# Patient Record
Sex: Female | Born: 1950 | Race: White | Hispanic: No | Marital: Single | Smoking: Never smoker
Health system: Southern US, Community
[De-identification: ages and names within clinical notes are randomized; demographics above are authoritative.]

## PROBLEM LIST (undated history)

## (undated) DIAGNOSIS — E7800 Pure hypercholesterolemia, unspecified: Secondary | ICD-10-CM

## (undated) DIAGNOSIS — F0390 Unspecified dementia without behavioral disturbance: Secondary | ICD-10-CM

## (undated) DIAGNOSIS — E079 Disorder of thyroid, unspecified: Secondary | ICD-10-CM

## (undated) DIAGNOSIS — M6530 Trigger finger, unspecified finger: Secondary | ICD-10-CM

## (undated) DIAGNOSIS — F0280 Dementia in other diseases classified elsewhere without behavioral disturbance: Secondary | ICD-10-CM

## (undated) DIAGNOSIS — E039 Hypothyroidism, unspecified: Secondary | ICD-10-CM

## (undated) HISTORY — PX: SMALL INTESTINE SURGERY: SHX150

---

## 2000-02-07 ENCOUNTER — Other Ambulatory Visit: Admission: RE | Admit: 2000-02-07 | Discharge: 2000-02-07 | Payer: Self-pay | Admitting: Obstetrics and Gynecology

## 2001-05-15 ENCOUNTER — Other Ambulatory Visit: Admission: RE | Admit: 2001-05-15 | Discharge: 2001-05-15 | Payer: Self-pay | Admitting: Obstetrics and Gynecology

## 2001-09-30 ENCOUNTER — Ambulatory Visit (HOSPITAL_COMMUNITY): Admission: RE | Admit: 2001-09-30 | Discharge: 2001-09-30 | Payer: Self-pay | Admitting: Obstetrics and Gynecology

## 2001-09-30 ENCOUNTER — Encounter: Payer: Self-pay | Admitting: Obstetrics and Gynecology

## 2003-09-04 ENCOUNTER — Inpatient Hospital Stay (HOSPITAL_COMMUNITY): Admission: EM | Admit: 2003-09-04 | Discharge: 2003-09-13 | Payer: Self-pay | Admitting: Emergency Medicine

## 2004-08-10 ENCOUNTER — Other Ambulatory Visit: Admission: RE | Admit: 2004-08-10 | Discharge: 2004-08-10 | Payer: Self-pay | Admitting: Obstetrics and Gynecology

## 2005-08-02 ENCOUNTER — Ambulatory Visit: Payer: Self-pay | Admitting: Endocrinology

## 2015-02-10 ENCOUNTER — Other Ambulatory Visit: Payer: Self-pay | Admitting: Orthopedic Surgery

## 2015-02-15 ENCOUNTER — Encounter (HOSPITAL_BASED_OUTPATIENT_CLINIC_OR_DEPARTMENT_OTHER): Payer: Self-pay | Admitting: *Deleted

## 2015-02-21 ENCOUNTER — Ambulatory Visit (HOSPITAL_BASED_OUTPATIENT_CLINIC_OR_DEPARTMENT_OTHER): Payer: 59 | Admitting: Anesthesiology

## 2015-02-21 ENCOUNTER — Ambulatory Visit (HOSPITAL_BASED_OUTPATIENT_CLINIC_OR_DEPARTMENT_OTHER)
Admission: RE | Admit: 2015-02-21 | Discharge: 2015-02-21 | Disposition: A | Discharge status: Home / Self Care | Payer: 59 | Source: Ambulatory Visit | Attending: Orthopedic Surgery | Admitting: Orthopedic Surgery

## 2015-02-21 ENCOUNTER — Encounter (HOSPITAL_BASED_OUTPATIENT_CLINIC_OR_DEPARTMENT_OTHER): Payer: Self-pay | Admitting: *Deleted

## 2015-02-21 ENCOUNTER — Encounter (HOSPITAL_BASED_OUTPATIENT_CLINIC_OR_DEPARTMENT_OTHER)
Admission: RE | Disposition: A | Discharge status: Home / Self Care | Payer: Self-pay | Source: Ambulatory Visit | Attending: Orthopedic Surgery

## 2015-02-21 DIAGNOSIS — Z87891 Personal history of nicotine dependence: Secondary | ICD-10-CM | POA: Diagnosis not present

## 2015-02-21 DIAGNOSIS — E039 Hypothyroidism, unspecified: Secondary | ICD-10-CM | POA: Insufficient documentation

## 2015-02-21 DIAGNOSIS — Z79899 Other long term (current) drug therapy: Secondary | ICD-10-CM | POA: Diagnosis not present

## 2015-02-21 DIAGNOSIS — E780 Pure hypercholesterolemia: Secondary | ICD-10-CM | POA: Insufficient documentation

## 2015-02-21 DIAGNOSIS — M65331 Trigger finger, right middle finger: Secondary | ICD-10-CM | POA: Insufficient documentation

## 2015-02-21 HISTORY — PX: TRIGGER FINGER RELEASE: SHX641

## 2015-02-21 HISTORY — DX: Pure hypercholesterolemia, unspecified: E78.00

## 2015-02-21 HISTORY — DX: Hypothyroidism, unspecified: E03.9

## 2015-02-21 HISTORY — DX: Trigger finger, unspecified finger: M65.30

## 2015-02-21 SURGERY — RELEASE, A1 PULLEY, FOR TRIGGER FINGER
Anesthesia: Regional | Site: Finger | Laterality: Right

## 2015-02-21 MED ORDER — DEXAMETHASONE SODIUM PHOSPHATE 10 MG/ML IJ SOLN
INTRAMUSCULAR | Status: DC | PRN
  Administered 2015-02-21: 10 mg via INTRAVENOUS

## 2015-02-21 MED ORDER — CEFAZOLIN SODIUM-DEXTROSE 2-3 GM-% IV SOLR
2.0000 g | INTRAVENOUS | Status: AC
  Administered 2015-02-21: 2 g via INTRAVENOUS

## 2015-02-21 MED ORDER — LACTATED RINGERS IV SOLN
INTRAVENOUS | Status: DC
  Administered 2015-02-21 (×2): via INTRAVENOUS

## 2015-02-21 MED ORDER — CHLORHEXIDINE GLUCONATE 4 % EX LIQD: 60.0000 mL | Freq: Once | CUTANEOUS | Status: DC

## 2015-02-21 MED ORDER — OXYCODONE HCL 5 MG/5ML PO SOLN: 5.0000 mg | Freq: Once | ORAL | Status: DC | PRN

## 2015-02-21 MED ORDER — GLYCOPYRROLATE 0.2 MG/ML IJ SOLN: 0.2000 mg | Freq: Once | INTRAMUSCULAR | Status: DC | PRN

## 2015-02-21 MED ORDER — FENTANYL CITRATE (PF) 100 MCG/2ML IJ SOLN: 50.0000 ug | INTRAMUSCULAR | Status: DC | PRN

## 2015-02-21 MED ORDER — HYDROCODONE-ACETAMINOPHEN 5-325 MG PO TABS: ORAL_TABLET | ORAL | Status: DC

## 2015-02-21 MED ORDER — BUPIVACAINE HCL (PF) 0.25 % IJ SOLN
INTRAMUSCULAR | Status: DC | PRN
  Administered 2015-02-21: 6 mL

## 2015-02-21 MED ORDER — MIDAZOLAM HCL 2 MG/2ML IJ SOLN
INTRAMUSCULAR | Status: AC
  Filled 2015-02-21: qty 2

## 2015-02-21 MED ORDER — KETOROLAC TROMETHAMINE 30 MG/ML IJ SOLN: 30.0000 mg | Freq: Once | INTRAMUSCULAR | Status: DC | PRN

## 2015-02-21 MED ORDER — CEFAZOLIN SODIUM-DEXTROSE 2-3 GM-% IV SOLR
INTRAVENOUS | Status: AC
  Filled 2015-02-21: qty 50

## 2015-02-21 MED ORDER — FENTANYL CITRATE (PF) 100 MCG/2ML IJ SOLN
INTRAMUSCULAR | Status: DC | PRN
  Administered 2015-02-21 (×2): 50 ug via INTRAVENOUS

## 2015-02-21 MED ORDER — HYDROMORPHONE HCL 1 MG/ML IJ SOLN: 0.2500 mg | INTRAMUSCULAR | Status: DC | PRN

## 2015-02-21 MED ORDER — BUPIVACAINE HCL (PF) 0.25 % IJ SOLN
INTRAMUSCULAR | Status: AC
  Filled 2015-02-21: qty 30

## 2015-02-21 MED ORDER — FENTANYL CITRATE (PF) 100 MCG/2ML IJ SOLN
INTRAMUSCULAR | Status: AC
  Filled 2015-02-21: qty 4

## 2015-02-21 MED ORDER — OXYCODONE HCL 5 MG PO TABS: 5.0000 mg | ORAL_TABLET | Freq: Once | ORAL | Status: DC | PRN

## 2015-02-21 MED ORDER — PROPOFOL INFUSION 10 MG/ML OPTIME
INTRAVENOUS | Status: DC | PRN
  Administered 2015-02-21: 50 ug/kg/min via INTRAVENOUS

## 2015-02-21 MED ORDER — LIDOCAINE HCL (CARDIAC) 20 MG/ML IV SOLN
INTRAVENOUS | Status: DC | PRN
  Administered 2015-02-21: 30 mg via INTRAVENOUS

## 2015-02-21 MED ORDER — MEPERIDINE HCL 25 MG/ML IJ SOLN: 6.2500 mg | INTRAMUSCULAR | Status: DC | PRN

## 2015-02-21 MED ORDER — MIDAZOLAM HCL 2 MG/2ML IJ SOLN
1.0000 mg | INTRAMUSCULAR | Status: DC | PRN
  Administered 2015-02-21 (×2): 1 mg via INTRAVENOUS

## 2015-02-21 MED ORDER — PROMETHAZINE HCL 25 MG/ML IJ SOLN: 6.2500 mg | INTRAMUSCULAR | Status: DC | PRN

## 2015-02-21 SURGICAL SUPPLY — 38 items
BANDAGE COBAN STERILE 2 (GAUZE/BANDAGES/DRESSINGS) ×3 IMPLANT
BLADE MINI RND TIP GREEN BEAV (BLADE) IMPLANT
BLADE SURG 15 STRL LF DISP TIS (BLADE) ×2 IMPLANT
BLADE SURG 15 STRL SS (BLADE) ×6
BNDG CMPR 9X4 STRL LF SNTH (GAUZE/BANDAGES/DRESSINGS)
BNDG CONFORM 2 STRL LF (GAUZE/BANDAGES/DRESSINGS) ×3 IMPLANT
BNDG ESMARK 4X9 LF (GAUZE/BANDAGES/DRESSINGS) IMPLANT
CHLORAPREP W/TINT 26ML (MISCELLANEOUS) ×3 IMPLANT
CORDS BIPOLAR (ELECTRODE) ×3 IMPLANT
COVER BACK TABLE 60X90IN (DRAPES) ×3 IMPLANT
COVER MAYO STAND STRL (DRAPES) ×3 IMPLANT
CUFF TOURNIQUET SINGLE 18IN (TOURNIQUET CUFF) ×3 IMPLANT
DRAPE EXTREMITY T 121X128X90 (DRAPE) ×3 IMPLANT
DRAPE SURG 17X23 STRL (DRAPES) ×3 IMPLANT
GAUZE SPONGE 4X4 12PLY STRL (GAUZE/BANDAGES/DRESSINGS) ×3 IMPLANT
GAUZE XEROFORM 1X8 LF (GAUZE/BANDAGES/DRESSINGS) ×3 IMPLANT
GLOVE BIO SURGEON STRL SZ 6.5 (GLOVE) ×1 IMPLANT
GLOVE BIO SURGEON STRL SZ7.5 (GLOVE) ×3 IMPLANT
GLOVE BIO SURGEONS STRL SZ 6.5 (GLOVE) ×1
GLOVE BIOGEL PI IND STRL 7.0 (GLOVE) IMPLANT
GLOVE BIOGEL PI IND STRL 8 (GLOVE) ×1 IMPLANT
GLOVE BIOGEL PI INDICATOR 7.0 (GLOVE) ×2
GLOVE BIOGEL PI INDICATOR 8 (GLOVE) ×2
GLOVE EXAM NITRILE MD LF STRL (GLOVE) ×2 IMPLANT
GOWN STRL REUS W/ TWL LRG LVL3 (GOWN DISPOSABLE) ×1 IMPLANT
GOWN STRL REUS W/TWL LRG LVL3 (GOWN DISPOSABLE) ×3
NDL HYPO 25X1 1.5 SAFETY (NEEDLE) IMPLANT
NEEDLE HYPO 25X1 1.5 SAFETY (NEEDLE) ×3 IMPLANT
NS IRRIG 1000ML POUR BTL (IV SOLUTION) ×3 IMPLANT
PACK BASIN DAY SURGERY FS (CUSTOM PROCEDURE TRAY) ×3 IMPLANT
PADDING CAST ABS 4INX4YD NS (CAST SUPPLIES) ×2
PADDING CAST ABS COTTON 4X4 ST (CAST SUPPLIES) ×1 IMPLANT
STOCKINETTE 4X48 STRL (DRAPES) ×3 IMPLANT
SUT ETHILON 4 0 PS 2 18 (SUTURE) ×3 IMPLANT
SYR BULB 3OZ (MISCELLANEOUS) ×3 IMPLANT
SYR CONTROL 10ML LL (SYRINGE) ×2 IMPLANT
TOWEL OR 17X24 6PK STRL BLUE (TOWEL DISPOSABLE) ×6 IMPLANT
UNDERPAD 30X30 (UNDERPADS AND DIAPERS) ×3 IMPLANT

## 2015-02-21 NOTE — Transfer of Care (Signed)
Immediate Anesthesia Transfer of Care Note  Patient: Monica Fowler  Procedure(s) Performed: Procedure(s): RIGHT LONG TRIGGER RELEASE  (Right)  Patient Location: PACU  Anesthesia Type:MAC and Bier block  Level of Consciousness: awake  Airway & Oxygen Therapy: Patient Spontanous Breathing and Patient connected to face mask oxygen  Post-op Assessment: Report given to RN and Post -op Vital signs reviewed and stable  Post vital signs: Reviewed and stable  Last Vitals:  Filed Vitals:   02/21/15 1159  BP: 143/84  Pulse: 78  Temp: 36.7 C  Resp: 18    Complications: No apparent anesthesia complications

## 2015-02-21 NOTE — Op Note (Signed)
[id number] 

## 2015-02-21 NOTE — Anesthesia Postprocedure Evaluation (Signed)
Anesthesia Post Note  Patient: Monica Fowler  Procedure(s) Performed: Procedure(s) (LRB): RIGHT LONG TRIGGER RELEASE  (Right)  Anesthesia type: MAC  Patient location: PACU  Post pain: Pain level controlled  Post assessment: Post-op Vital signs reviewed  Last Vitals: BP 122/72 mmHg  Pulse 62  Temp(Src) 36.4 C (Oral)  Resp 16  Ht 5\' 5"  (1.651 m)  Wt 134 lb (60.782 kg)  BMI 22.30 kg/m2  SpO2 98%  Post vital signs: Reviewed  Level of consciousness: awake  Complications: No apparent anesthesia complications

## 2015-02-21 NOTE — Discharge Instructions (Addendum)

## 2015-02-21 NOTE — Anesthesia Procedure Notes (Signed)
Anesthesia Regional Block:  Bier block (IV Regional)  Pre-Anesthetic Checklist: ,, timeout performed, Correct Patient, Correct Site, Correct Laterality, Correct Procedure,, site marked, surgical consent,, at surgeon's request Needles:  Injection technique: Single-shot  Needle Type: Other      Needle Gauge: 20 and 20 G    Additional Needles: Bier block (IV Regional) Narrative:  Start time: 02/21/2015 1:55 PM End time: 02/21/2015 1:56 PM Injection made incrementally with aspirations every 35 mL.  Performed by: Personally   Additional Notes: Esmark wrap, torq inflated 250, neg pulse, injected 35 cc 0.5% pres free Lidocaine. Pt tol well, VSS, no neg side effects noted.

## 2015-02-21 NOTE — Brief Op Note (Signed)
02/21/2015  2:23 PM  PATIENT:  Monica Fowler  64 y.o. female  PRE-OPERATIVE DIAGNOSIS:  RIGHT LONG FINGER TRIGGER  POST-OPERATIVE DIAGNOSIS:  RIGHT LONG FINGER TRIGGER  PROCEDURE:  Procedure(s): RIGHT LONG TRIGGER RELEASE  (Right)  SURGEON:  Surgeon(s) and Role:    * Leanora Cover, MD - Primary  PHYSICIAN ASSISTANT:   ASSISTANTS: none   ANESTHESIA:   Bier block with sedation  EBL:  Total I/O In: 1000 [I.V.:1000] Out: -   BLOOD ADMINISTERED:none  DRAINS: none   LOCAL MEDICATIONS USED:  MARCAINE     SPECIMEN:  No Specimen  DISPOSITION OF SPECIMEN:  N/A  COUNTS:  YES  TOURNIQUET:   Total Tourniquet Time Documented: Forearm (Right) - 20 minutes Total: Forearm (Right) - 20 minutes   DICTATION: .Other Dictation: Dictation Number [id number]  PLAN OF CARE: Discharge to home after PACU  PATIENT DISPOSITION:  PACU - hemodynamically stable.

## 2015-02-21 NOTE — H&P (Signed)
  Monica Fowler is an 64 y.o. female.   Chief Complaint: right long trigger digit HPI: 64 yo female with triggering right long finger.  This has been injected twice with recurrence of triggering.  She wishes to have a trigger release.  Past Medical History  Diagnosis Date  . Hypothyroidism   . High cholesterol   . Trigger finger of right hand     RLF    Past Surgical History  Procedure Laterality Date  . Small intestine surgery      History reviewed. No pertinent family history. Social History:  reports that she has quit smoking. She has never used smokeless tobacco. She reports that she drinks alcohol. Her drug history is not on file.  Allergies:  Allergies  Allergen Reactions  . Codeine Nausea And Vomiting    Medications Prior to Admission  Medication Sig Dispense Refill  . atorvastatin (LIPITOR) 20 MG tablet Take 20 mg by mouth daily.    Marland Kitchen levothyroxine (SYNTHROID, LEVOTHROID) 137 MCG tablet Take 137 mcg by mouth daily before breakfast.    . simvastatin (ZOCOR) 20 MG tablet Take 20 mg by mouth daily.      No results found for this or any previous visit (from the past 48 hour(s)).  No results found.   A comprehensive review of systems was negative except for: Eyes: positive for contacts/glasses  Blood pressure 143/84, pulse 78, temperature 98 F (36.7 C), temperature source Oral, resp. rate 18, height 5\' 5"  (1.651 m), weight 60.782 kg (134 lb), SpO2 100 %.  General appearance: alert, cooperative and appears stated age Head: Normocephalic, without obvious abnormality, atraumatic Neck: supple, symmetrical, trachea midline Resp: clear to auscultation bilaterally Cardio: regular rate and rhythm GI: non tender Extremities: intact sensation and capillary refill all digits.  +epl/fpl/io.  no wounds. Pulses: 2+ and symmetric Skin: Skin color, texture, turgor normal. No rashes or lesions Neurologic: Grossly normal Incision/Wound:  none  Assessment/Plan Right  long finger trigger digit.  Non operative and operative treatment options were discussed with the patient and patient wishes to proceed with operative treatment. Risks, benefits, and alternatives of surgery were discussed and the patient agrees with the plan of care.   Andy Allende R 02/21/2015, 12:37 PM

## 2015-02-21 NOTE — Anesthesia Preprocedure Evaluation (Addendum)
Anesthesia Evaluation  Patient identified by MRN, date of birth, ID band Patient awake    Reviewed: Allergy & Precautions, NPO status , Patient's Chart, lab work & pertinent test results  Airway Mallampati: II  TM Distance: >3 FB Neck ROM: Full    Dental no notable dental hx.    Pulmonary former smoker,  breath sounds clear to auscultation  Pulmonary exam normal       Cardiovascular negative cardio ROS Normal cardiovascular examRhythm:Regular Rate:Normal     Neuro/Psych negative neurological ROS  negative psych ROS   GI/Hepatic negative GI ROS, Neg liver ROS,   Endo/Other  Hypothyroidism   Renal/GU negative Renal ROS     Musculoskeletal negative musculoskeletal ROS (+)   Abdominal   Peds  Hematology negative hematology ROS (+)   Anesthesia Other Findings   Reproductive/Obstetrics negative OB ROS                            Anesthesia Physical Anesthesia Plan  ASA: I  Anesthesia Plan: Bier Block   Post-op Pain Management:    Induction: Intravenous  Airway Management Planned:   Additional Equipment:   Intra-op Plan:   Post-operative Plan:   Informed Consent: I have reviewed the patients History and Physical, chart, labs and discussed the procedure including the risks, benefits and alternatives for the proposed anesthesia with the patient or authorized representative who has indicated his/her understanding and acceptance.   Dental advisory given  Plan Discussed with: CRNA  Anesthesia Plan Comments:        Anesthesia Quick Evaluation

## 2015-02-22 ENCOUNTER — Encounter (HOSPITAL_BASED_OUTPATIENT_CLINIC_OR_DEPARTMENT_OTHER): Payer: Self-pay | Admitting: Orthopedic Surgery

## 2015-02-22 NOTE — Op Note (Signed)
Monica Fowler, Monica Fowler            ACCOUNT NO.:  [id number]  MEDICAL RECORD NO.:  [id number]  LOCATION:                                 FACILITY:  PHYSICIAN:  Leanora Cover, MD        DATE OF BIRTH:  1951/08/26  DATE OF PROCEDURE:  02/21/2015 DATE OF DISCHARGE:  02/21/2015                              OPERATIVE REPORT   PREOPERATIVE DIAGNOSIS:  Right long finger trigger digit.  POSTOPERATIVE DIAGNOSIS:  Right long finger trigger digit.  PROCEDURE:  Right long finger trigger release.  SURGEON:  Leanora Cover, MD.  ASSISTANT:  None.  ANESTHESIA:  Bier block with sedation.  IV FLUIDS:  Per anesthesia flow sheet.  ESTIMATED BLOOD LOSS:  Minimal.  COMPLICATIONS:  None.  SPECIMENS:  None.  TOURNIQUET TIME:  20 minutes.  DISPOSITION:  Stable to PACU.  INDICATIONS:  This patient is a 64 year old female with triggering of the long finger.  This has been injected twice with recurrence.  She wished to have a trigger release for management of symptoms.  Risks, benefits, and alternatives of the surgery were discussed including risk of blood loss; infection; damage to nerves, vessels, tendons, ligaments, and bone; failure of surgery; need for additional surgery; complications with wound healing; continued pain; recurrence of triggering.  She voiced understanding of these risks and elected to proceed.  OPERATIVE COURSE:  After being identified preoperatively by myself, the patient and I agreed upon procedure and site procedure.  Surgical site was marked.  The risks, benefits, and alternatives of the surgery were reviewed and she wished to proceed.  Surgical consent had been signed. She was given IV Ancef as preoperative antibiotic prophylaxis.  She was transferred to the operating room and placed on the operating room table in supine position with the right upper extremity on arm board.  Bier block anesthesia was induced by Anesthesiology.  Right upper extremity was prepped and  draped in normal sterile orthopedic fashion.  A surgical pause was performed between surgeons, anesthesia, and operating room staff, and all were in agreement as to the patient, procedure, and site of the procedure.  Tourniquet at the proximal aspect of the forearm had been inflated for the Bier block.  Incision was made over the MP joint of the long finger on the palmar side.  This was carried into the subcutaneous tissues by spreading technique.  The A1 pulley was identified and sharply incised.  The proximal aspect of the A2 pulley was vented to allow a funnel for the tendons.  There was thickened synovium that was adherent between the tendons, this was removed.  The wound was copiously irrigated with sterile saline.  Her finger was placed through a range of motion.  There was no recurrence of triggering.  The tendons were brought through the wound and there was no triggering.  The wound was closed with 4-0 nylon in a horizontal mattress fashion.  It was injected with 7 mL of 0.25% plain Marcaine to aid in postoperative analgesia.  It was then dressed with sterile Xeroform, 4x4s, and wrapped with a Kling Coban dressing lightly. Tourniquet was deflated at 20 minutes.  Fingertips were pink with brisk capillary refill after the  deflation of tourniquet.  Operative drapes were broken down.  The patient was awakened from anesthesia safely.  She was transferred back to stretcher and taken to PACU in stable condition. I will see her back in the office in 1 week for postoperative followup. I will give her Norco 5/325, 1-2 p.o. q.6 hours p.r.n. pain, dispensed #20.  She states she has taken this in the past without problems.     Leanora Cover, MD     KK/MEDQ  D:  02/21/2015  T:  02/22/2015  Job:  [id number]

## 2016-04-03 DIAGNOSIS — E890 Postprocedural hypothyroidism: Secondary | ICD-10-CM | POA: Diagnosis not present

## 2016-04-04 DIAGNOSIS — E890 Postprocedural hypothyroidism: Secondary | ICD-10-CM | POA: Diagnosis not present

## 2016-06-03 DIAGNOSIS — E890 Postprocedural hypothyroidism: Secondary | ICD-10-CM | POA: Diagnosis not present

## 2016-06-12 DIAGNOSIS — E890 Postprocedural hypothyroidism: Secondary | ICD-10-CM | POA: Diagnosis not present

## 2016-07-17 DIAGNOSIS — L820 Inflamed seborrheic keratosis: Secondary | ICD-10-CM | POA: Diagnosis not present

## 2016-08-07 DIAGNOSIS — E890 Postprocedural hypothyroidism: Secondary | ICD-10-CM | POA: Diagnosis not present

## 2016-08-08 DIAGNOSIS — E7800 Pure hypercholesterolemia, unspecified: Secondary | ICD-10-CM | POA: Diagnosis not present

## 2016-08-08 DIAGNOSIS — E890 Postprocedural hypothyroidism: Secondary | ICD-10-CM | POA: Diagnosis not present

## 2016-08-12 DIAGNOSIS — Z23 Encounter for immunization: Secondary | ICD-10-CM | POA: Diagnosis not present

## 2016-08-12 DIAGNOSIS — E890 Postprocedural hypothyroidism: Secondary | ICD-10-CM | POA: Diagnosis not present

## 2016-12-06 DIAGNOSIS — E890 Postprocedural hypothyroidism: Secondary | ICD-10-CM | POA: Diagnosis not present

## 2016-12-06 DIAGNOSIS — E7800 Pure hypercholesterolemia, unspecified: Secondary | ICD-10-CM | POA: Diagnosis not present

## 2016-12-10 DIAGNOSIS — E039 Hypothyroidism, unspecified: Secondary | ICD-10-CM | POA: Diagnosis not present

## 2016-12-10 DIAGNOSIS — Z1389 Encounter for screening for other disorder: Secondary | ICD-10-CM | POA: Diagnosis not present

## 2016-12-10 DIAGNOSIS — Z0000 Encounter for general adult medical examination without abnormal findings: Secondary | ICD-10-CM | POA: Diagnosis not present

## 2016-12-10 DIAGNOSIS — E7800 Pure hypercholesterolemia, unspecified: Secondary | ICD-10-CM | POA: Diagnosis not present

## 2016-12-10 DIAGNOSIS — Z23 Encounter for immunization: Secondary | ICD-10-CM | POA: Diagnosis not present

## 2016-12-10 DIAGNOSIS — Z13820 Encounter for screening for osteoporosis: Secondary | ICD-10-CM | POA: Diagnosis not present

## 2017-02-24 DIAGNOSIS — Z1231 Encounter for screening mammogram for malignant neoplasm of breast: Secondary | ICD-10-CM | POA: Diagnosis not present

## 2017-05-12 DIAGNOSIS — Z13820 Encounter for screening for osteoporosis: Secondary | ICD-10-CM | POA: Diagnosis not present

## 2017-05-12 DIAGNOSIS — M8588 Other specified disorders of bone density and structure, other site: Secondary | ICD-10-CM | POA: Diagnosis not present

## 2017-05-15 ENCOUNTER — Ambulatory Visit (INDEPENDENT_AMBULATORY_CARE_PROVIDER_SITE_OTHER): Payer: PPO | Admitting: Obstetrics & Gynecology

## 2017-05-15 ENCOUNTER — Encounter: Payer: Self-pay | Admitting: Obstetrics & Gynecology

## 2017-05-15 VITALS — BP 122/70 | Ht 64.0 in | Wt 120.0 lb

## 2017-05-15 DIAGNOSIS — Z01411 Encounter for gynecological examination (general) (routine) with abnormal findings: Secondary | ICD-10-CM | POA: Diagnosis not present

## 2017-05-15 DIAGNOSIS — Z780 Asymptomatic menopausal state: Secondary | ICD-10-CM

## 2017-05-15 DIAGNOSIS — N952 Postmenopausal atrophic vaginitis: Secondary | ICD-10-CM

## 2017-05-15 NOTE — Addendum Note (Signed)
Addended by: Thurnell Garbe A on: 05/15/2017 12:35 PM   Modules accepted: Orders

## 2017-05-15 NOTE — Progress Notes (Signed)
Monica Fowler 1951-01-04 [id number]   History:    66 y.o.  G3P1A2 Retired  RP:  Established patient presenting for annual gyn exam   HPI:  Menopause.  No HRT.  No PMB.  No pelvic pain.  Abstinent x 5 years.  Breasts wnl.  Mictions normal.  Does Kegels with each miction.  BMs wnl.  Fit/active/good nutrition.  BMI 20.6.  Past medical history,surgical history, family history and social history were all reviewed and documented in the EPIC chart.  Gynecologic History No LMP recorded. Patient is postmenopausal. Contraception: post menopausal status Last Pap: 2016-2017. Results were: normal per patient.  Will obtain medical records Last mammogram: 2018. Results were: normal Colono normal 5 years ago Dexa 2018  Obstetric History OB History  Gravida Para Term Preterm AB Living  3 1     2 1   SAB TAB Ectopic Multiple Live Births  2            # Outcome Date GA Lbr Len/2nd Weight Sex Delivery Anes PTL Lv  3 SAB           2 SAB           1 Para                ROS: A ROS was performed and pertinent positives and negatives are included in the history.  GENERAL: No fevers or chills. HEENT: No change in vision, no earache, sore throat or sinus congestion. NECK: No pain or stiffness. CARDIOVASCULAR: No chest pain or pressure. No palpitations. PULMONARY: No shortness of breath, cough or wheeze. GASTROINTESTINAL: No abdominal pain, nausea, vomiting or diarrhea, melena or bright red blood per rectum. GENITOURINARY: No urinary frequency, urgency, hesitancy or dysuria. MUSCULOSKELETAL: No joint or muscle pain, no back pain, no recent trauma. DERMATOLOGIC: No rash, no itching, no lesions. ENDOCRINE: No polyuria, polydipsia, no heat or cold intolerance. No recent change in weight. HEMATOLOGICAL: No anemia or easy bruising or bleeding. NEUROLOGIC: No headache, seizures, numbness, tingling or weakness. PSYCHIATRIC: No depression, no loss of interest in normal activity or change in sleep pattern.     Exam:   BP 122/70   Ht 5\' 4"  (1.626 m)   Wt 120 lb (54.4 kg)   BMI 20.60 kg/m   Body mass index is 20.6 kg/m.  General appearance : Well developed well nourished female. No acute distress HEENT: Eyes: no retinal hemorrhage or exudates,  Neck supple, trachea midline, no carotid bruits, no thyroidmegaly Lungs: Clear to auscultation, no rhonchi or wheezes, or rib retractions  Heart: Regular rate and rhythm, no murmurs or gallops Breast:Examined in sitting and supine position were symmetrical in appearance, no palpable masses or tenderness,  no skin retraction, no nipple inversion, no nipple discharge, no skin discoloration, no axillary or supraclavicular lymphadenopathy Abdomen: no palpable masses or tenderness, no rebound or guarding Extremities: no edema or skin discoloration or tenderness  Pelvic: Vulva atrophy  Bartholin, Urethra, Skene Glands: Within normal limits             Vagina: No gross lesions or discharge  Cervix: No gross lesions or discharge.  Pap reflex done.  Uterus  AV, normal size, shape and consistency, non-tender and mobile  Adnexa  Without masses or tenderness  Anus and perineum  normal    Assessment/Plan:  66 y.o. female for annual exam   1. 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  Pap reflex done.  Breasts  wnl.  Colono normal 5 years ago.  Doing preventive Kegel exercises.  2. Menopause present No HRT.  No PMB.  Bone Density 2018.  Vit D supplement/Ca++ in nutrition.  Weight bearing physical activity.  3. Post-menopausal atrophic vaginitis Asymptomatic.  Abstinent.  Princess Bruins MD, 11:27 AM 05/15/2017

## 2017-05-15 NOTE — Patient Instructions (Signed)
1. 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  Pap reflex done.  Breasts wnl.  Colono normal 5 years ago.  Doing preventive Kegel exercises.  2. Menopause present No HRT.  No PMB.  Bone Density 2018.  Vit D supplement/Ca++ in nutrition.  Weight bearing physical activity.  3. Post-menopausal atrophic vaginitis Asymptomatic.  Abstinent.  Monica Fowler, it was a pleasure to see you today!  I will inform you of your results as soon as available.   Health Maintenance for Postmenopausal Women Menopause is a normal process in which your reproductive ability comes to an end. This process happens gradually over a span of months to years, usually between the ages of 70 and 75. Menopause is complete when you have missed 12 consecutive menstrual periods. It is important to talk with your health care provider about some of the most common conditions that affect postmenopausal women, such as heart disease, cancer, and bone loss (osteoporosis). Adopting a healthy lifestyle and getting preventive care can help to promote your health and wellness. Those actions can also lower your chances of developing some of these common conditions. What should I know about menopause? During menopause, you may experience a number of symptoms, such as:  Moderate-to-severe hot flashes.  Night sweats.  Decrease in sex drive.  Mood swings.  Headaches.  Tiredness.  Irritability.  Memory problems.  Insomnia.  Choosing to treat or not to treat menopausal changes is an individual decision that you make with your health care provider. What should I know about hormone replacement therapy and supplements? Hormone therapy products are effective for treating symptoms that are associated with menopause, such as hot flashes and night sweats. Hormone replacement carries certain risks, especially as you become older. If you are thinking about using estrogen or  estrogen with progestin treatments, discuss the benefits and risks with your health care provider. What should I know about heart disease and stroke? Heart disease, heart attack, and stroke become more likely as you age. This may be due, in part, to the hormonal changes that your body experiences during menopause. These can affect how your body processes dietary fats, triglycerides, and cholesterol. Heart attack and stroke are both medical emergencies. There are many things that you can do to help prevent heart disease and stroke:  Have your blood pressure checked at least every 1-2 years. High blood pressure causes heart disease and increases the risk of stroke.  If you are 44-79 years old, ask your health care provider if you should take aspirin to prevent a heart attack or a stroke.  Do not use any tobacco products, including cigarettes, chewing tobacco, or electronic cigarettes. If you need help quitting, ask your health care provider.  It is important to eat a healthy diet and maintain a healthy weight. ? Be sure to include plenty of vegetables, fruits, low-fat dairy products, and lean protein. ? Avoid eating foods that are high in solid fats, added sugars, or salt (sodium).  Get regular exercise. This is one of the most important things that you can do for your health. ? Try to exercise for at least 150 minutes each week. The type of exercise that you do should increase your heart rate and make you sweat. This is known as moderate-intensity exercise. ? Try to do strengthening exercises at least twice each week. Do these in addition to the moderate-intensity exercise.  Know your numbers.Ask your health care provider to check your cholesterol and your blood  glucose. Continue to have your blood tested as directed by your health care provider.  What should I know about cancer screening? There are several types of cancer. Take the following steps to reduce your risk and to catch any cancer  development as early as possible. Breast Cancer  Practice breast self-awareness. ? This means understanding how your breasts normally appear and feel. ? It also means doing regular breast self-exams. Let your health care provider know about any changes, no matter how small.  If you are 64 or older, have a clinician do a breast exam (clinical breast exam or CBE) every year. Depending on your age, family history, and medical history, it may be recommended that you also have a yearly breast X-ray (mammogram).  If you have a family history of breast cancer, talk with your health care provider about genetic screening.  If you are at high risk for breast cancer, talk with your health care provider about having an MRI and a mammogram every year.  Breast cancer (BRCA) gene test is recommended for women who have family members with BRCA-related cancers. Results of the assessment will determine the need for genetic counseling and BRCA1 and for BRCA2 testing. BRCA-related cancers include these types: ? Breast. This occurs in males or females. ? Ovarian. ? Tubal. This may also be called fallopian tube cancer. ? Cancer of the abdominal or pelvic lining (peritoneal cancer). ? Prostate. ? Pancreatic.  Cervical, Uterine, and Ovarian Cancer Your health care provider may recommend that you be screened regularly for cancer of the pelvic organs. These include your ovaries, uterus, and vagina. This screening involves a pelvic exam, which includes checking for microscopic changes to the surface of your cervix (Pap test).  For women ages 21-65, health care providers may recommend a pelvic exam and a Pap test every three years. For women ages 11-65, they may recommend the Pap test and pelvic exam, combined with testing for human papilloma virus (HPV), every five years. Some types of HPV increase your risk of cervical cancer. Testing for HPV may also be done on women of any age who have unclear Pap test  results.  Other health care providers may not recommend any screening for nonpregnant women who are considered low risk for pelvic cancer and have no symptoms. Ask your health care provider if a screening pelvic exam is right for you.  If you have had past treatment for cervical cancer or a condition that could lead to cancer, you need Pap tests and screening for cancer for at least 20 years after your treatment. If Pap tests have been discontinued for you, your risk factors (such as having a new sexual partner) need to be reassessed to determine if you should start having screenings again. Some women have medical problems that increase the chance of getting cervical cancer. In these cases, your health care provider may recommend that you have screening and Pap tests more often.  If you have a family history of uterine cancer or ovarian cancer, talk with your health care provider about genetic screening.  If you have vaginal bleeding after reaching menopause, tell your health care provider.  There are currently no reliable tests available to screen for ovarian cancer.  Lung Cancer Lung cancer screening is recommended for adults 24-14 years old who are at high risk for lung cancer because of a history of smoking. A yearly low-dose CT scan of the lungs is recommended if you:  Currently smoke.  Have a history of at  least 30 pack-years of smoking and you currently smoke or have quit within the past 15 years. A pack-year is smoking an average of one pack of cigarettes per day for one year.  Yearly screening should:  Continue until it has been 15 years since you quit.  Stop if you develop a health problem that would prevent you from having lung cancer treatment.  Colorectal Cancer  This type of cancer can be detected and can often be prevented.  Routine colorectal cancer screening usually begins at age 60 and continues through age 68.  If you have risk factors for colon cancer, your health  care provider may recommend that you be screened at an earlier age.  If you have a family history of colorectal cancer, talk with your health care provider about genetic screening.  Your health care provider may also recommend using home test kits to check for hidden blood in your stool.  A small camera at the end of a tube can be used to examine your colon directly (sigmoidoscopy or colonoscopy). This is done to check for the earliest forms of colorectal cancer.  Direct examination of the colon should be repeated every 5-10 years until age 86. However, if early forms of precancerous polyps or small growths are found or if you have a family history or genetic risk for colorectal cancer, you may need to be screened more often.  Skin Cancer  Check your skin from head to toe regularly.  Monitor any moles. Be sure to tell your health care provider: ? About any new moles or changes in moles, especially if there is a change in a mole's shape or color. ? If you have a mole that is larger than the size of a pencil eraser.  If any of your family members has a history of skin cancer, especially at a young age, talk with your health care provider about genetic screening.  Always use sunscreen. Apply sunscreen liberally and repeatedly throughout the day.  Whenever you are outside, protect yourself by wearing long sleeves, pants, a wide-brimmed hat, and sunglasses.  What should I know about osteoporosis? Osteoporosis is a condition in which bone destruction happens more quickly than new bone creation. After menopause, you may be at an increased risk for osteoporosis. To help prevent osteoporosis or the bone fractures that can happen because of osteoporosis, the following is recommended:  If you are 25-28 years old, get at least 1,000 mg of calcium and at least 600 mg of vitamin D per day.  If you are older than age 50 but younger than age 39, get at least 1,200 mg of calcium and at least 600 mg of  vitamin D per day.  If you are older than age 37, get at least 1,200 mg of calcium and at least 800 mg of vitamin D per day.  Smoking and excessive alcohol intake increase the risk of osteoporosis. Eat foods that are rich in calcium and vitamin D, and do weight-bearing exercises several times each week as directed by your health care provider. What should I know about how menopause affects my mental health? Depression may occur at any age, but it is more common as you become older. Common symptoms of depression include:  Low or sad mood.  Changes in sleep patterns.  Changes in appetite or eating patterns.  Feeling an overall lack of motivation or enjoyment of activities that you previously enjoyed.  Frequent crying spells.  Talk with your health care provider if you  think that you are experiencing depression. What should I know about immunizations? It is important that you get and maintain your immunizations. These include:  Tetanus, diphtheria, and pertussis (Tdap) booster vaccine.  Influenza every year before the flu season begins.  Pneumonia vaccine.  Shingles vaccine.  Your health care provider may also recommend other immunizations. This information is not intended to replace advice given to you by your health care provider. Make sure you discuss any questions you have with your health care provider. Document Released: 11/01/2005 Document Revised: 03/29/2016 Document Reviewed: 06/13/2015 Elsevier Interactive Patient Education  2018 [employer].

## 2017-05-16 LAB — PAP IG W/ RFLX HPV ASCU

## 2017-12-04 DIAGNOSIS — E890 Postprocedural hypothyroidism: Secondary | ICD-10-CM | POA: Diagnosis not present

## 2017-12-04 DIAGNOSIS — Z1159 Encounter for screening for other viral diseases: Secondary | ICD-10-CM | POA: Diagnosis not present

## 2017-12-12 DIAGNOSIS — E890 Postprocedural hypothyroidism: Secondary | ICD-10-CM | POA: Diagnosis not present

## 2017-12-12 DIAGNOSIS — Z1389 Encounter for screening for other disorder: Secondary | ICD-10-CM | POA: Diagnosis not present

## 2017-12-12 DIAGNOSIS — Z0000 Encounter for general adult medical examination without abnormal findings: Secondary | ICD-10-CM | POA: Diagnosis not present

## 2017-12-12 DIAGNOSIS — Z23 Encounter for immunization: Secondary | ICD-10-CM | POA: Diagnosis not present

## 2018-01-05 DIAGNOSIS — H25042 Posterior subcapsular polar age-related cataract, left eye: Secondary | ICD-10-CM | POA: Diagnosis not present

## 2018-01-05 DIAGNOSIS — H2513 Age-related nuclear cataract, bilateral: Secondary | ICD-10-CM | POA: Diagnosis not present

## 2018-01-05 DIAGNOSIS — H43391 Other vitreous opacities, right eye: Secondary | ICD-10-CM | POA: Diagnosis not present

## 2018-01-05 DIAGNOSIS — H25011 Cortical age-related cataract, right eye: Secondary | ICD-10-CM | POA: Diagnosis not present

## 2018-09-11 DIAGNOSIS — H40021 Open angle with borderline findings, high risk, right eye: Secondary | ICD-10-CM | POA: Diagnosis not present

## 2018-10-12 DIAGNOSIS — Z1231 Encounter for screening mammogram for malignant neoplasm of breast: Secondary | ICD-10-CM | POA: Diagnosis not present

## 2018-11-09 DIAGNOSIS — L821 Other seborrheic keratosis: Secondary | ICD-10-CM | POA: Diagnosis not present

## 2018-11-09 DIAGNOSIS — D225 Melanocytic nevi of trunk: Secondary | ICD-10-CM | POA: Diagnosis not present

## 2018-11-09 DIAGNOSIS — D1801 Hemangioma of skin and subcutaneous tissue: Secondary | ICD-10-CM | POA: Diagnosis not present

## 2018-11-09 DIAGNOSIS — L853 Xerosis cutis: Secondary | ICD-10-CM | POA: Diagnosis not present

## 2018-11-09 DIAGNOSIS — L814 Other melanin hyperpigmentation: Secondary | ICD-10-CM | POA: Diagnosis not present

## 2018-11-09 DIAGNOSIS — D692 Other nonthrombocytopenic purpura: Secondary | ICD-10-CM | POA: Diagnosis not present

## 2018-12-07 DIAGNOSIS — E039 Hypothyroidism, unspecified: Secondary | ICD-10-CM | POA: Diagnosis not present

## 2018-12-30 DIAGNOSIS — M6281 Muscle weakness (generalized): Secondary | ICD-10-CM | POA: Diagnosis not present

## 2018-12-30 DIAGNOSIS — Z1151 Encounter for screening for human papillomavirus (HPV): Secondary | ICD-10-CM | POA: Diagnosis not present

## 2018-12-30 DIAGNOSIS — N3281 Overactive bladder: Secondary | ICD-10-CM | POA: Diagnosis not present

## 2018-12-30 DIAGNOSIS — Z0000 Encounter for general adult medical examination without abnormal findings: Secondary | ICD-10-CM | POA: Diagnosis not present

## 2018-12-30 DIAGNOSIS — Z780 Asymptomatic menopausal state: Secondary | ICD-10-CM | POA: Diagnosis not present

## 2018-12-30 DIAGNOSIS — E890 Postprocedural hypothyroidism: Secondary | ICD-10-CM | POA: Diagnosis not present

## 2018-12-30 DIAGNOSIS — Z124 Encounter for screening for malignant neoplasm of cervix: Secondary | ICD-10-CM | POA: Diagnosis not present

## 2018-12-30 DIAGNOSIS — I10 Essential (primary) hypertension: Secondary | ICD-10-CM | POA: Diagnosis not present

## 2018-12-30 DIAGNOSIS — E7800 Pure hypercholesterolemia, unspecified: Secondary | ICD-10-CM | POA: Diagnosis not present

## 2018-12-30 DIAGNOSIS — Z1389 Encounter for screening for other disorder: Secondary | ICD-10-CM | POA: Diagnosis not present

## 2018-12-30 DIAGNOSIS — K5909 Other constipation: Secondary | ICD-10-CM | POA: Diagnosis not present

## 2018-12-30 DIAGNOSIS — I6350 Cerebral infarction due to unspecified occlusion or stenosis of unspecified cerebral artery: Secondary | ICD-10-CM | POA: Diagnosis not present

## 2019-01-04 ENCOUNTER — Other Ambulatory Visit: Payer: Self-pay

## 2019-01-06 ENCOUNTER — Other Ambulatory Visit: Payer: Self-pay

## 2019-01-06 ENCOUNTER — Ambulatory Visit (INDEPENDENT_AMBULATORY_CARE_PROVIDER_SITE_OTHER): Payer: PPO | Admitting: Obstetrics & Gynecology

## 2019-01-06 ENCOUNTER — Encounter: Payer: Self-pay | Admitting: Obstetrics & Gynecology

## 2019-01-06 VITALS — BP 130/88 | Ht 64.0 in | Wt 123.0 lb

## 2019-01-06 DIAGNOSIS — Z01419 Encounter for gynecological examination (general) (routine) without abnormal findings: Secondary | ICD-10-CM | POA: Diagnosis not present

## 2019-01-06 DIAGNOSIS — Z13820 Encounter for screening for osteoporosis: Secondary | ICD-10-CM

## 2019-01-06 DIAGNOSIS — Z780 Asymptomatic menopausal state: Secondary | ICD-10-CM

## 2019-01-06 NOTE — Progress Notes (Signed)
Monica Fowler 05/20/1951 [id number]   History:    68 y.o. G3P1A2L1 Retired.  RP:  Established patient presenting for annual gyn exam   HPI: Postmenopausal on no hormone replacement therapy.  No postmenopausal bleeding.  No pelvic pain.  Abstinent.  Urine and bowel movements normal.  Breasts normal.  Body mass index at 21.11.  Physically active.  Health labs with family physician.  Past medical history,surgical history, family history and social history were all reviewed and documented in the EPIC chart.  Gynecologic History No LMP recorded. Patient is postmenopausal. Contraception: abstinence and post menopausal status Last Pap: 04/2017. Results were: Negative Last mammogram: 2019 per patient, will obtain from [facility] Bone Density: Will schedule here now. Colonoscopy: 7 yrs ago  Obstetric History OB History  Gravida Para Term Preterm AB Living  3 1     2 1   SAB TAB Ectopic Multiple Live Births  2            # Outcome Date GA Lbr Len/2nd Weight Sex Delivery Anes PTL Lv  3 SAB           2 SAB           1 Para              ROS: A ROS was performed and pertinent positives and negatives are included in the history.  GENERAL: No fevers or chills. HEENT: No change in vision, no earache, sore throat or sinus congestion. NECK: No pain or stiffness. CARDIOVASCULAR: No chest pain or pressure. No palpitations. PULMONARY: No shortness of breath, cough or wheeze. GASTROINTESTINAL: No abdominal pain, nausea, vomiting or diarrhea, melena or bright red blood per rectum. GENITOURINARY: No urinary frequency, urgency, hesitancy or dysuria. MUSCULOSKELETAL: No joint or muscle pain, no back pain, no recent trauma. DERMATOLOGIC: No rash, no itching, no lesions. ENDOCRINE: No polyuria, polydipsia, no heat or cold intolerance. No recent change in weight. HEMATOLOGICAL: No anemia or easy bruising or bleeding. NEUROLOGIC: No headache, seizures, numbness, tingling or weakness. PSYCHIATRIC: No depression,  no loss of interest in normal activity or change in sleep pattern.     Exam:   BP 130/88    Ht 5\' 4"  (1.626 m)    Wt 123 lb (55.8 kg)    BMI 21.11 kg/m   Body mass index is 21.11 kg/m.  General appearance : Well developed well nourished female. No acute distress HEENT: Eyes: no retinal hemorrhage or exudates,  Neck supple, trachea midline, no carotid bruits, no thyroidmegaly Lungs: Clear to auscultation, no rhonchi or wheezes, or rib retractions  Heart: Regular rate and rhythm, no murmurs or gallops Breast:Examined in sitting and supine position were symmetrical in appearance, no palpable masses or tenderness,  no skin retraction, no nipple inversion, no nipple discharge, no skin discoloration, no axillary or supraclavicular lymphadenopathy Abdomen: no palpable masses or tenderness, no rebound or guarding Extremities: no edema or skin discoloration or tenderness  Pelvic: Vulva: Normal             Vagina: No gross lesions or discharge  Cervix: No gross lesions or discharge  Uterus  AV, normal size, shape and consistency, non-tender and mobile  Adnexa  Without masses or tenderness  Anus: Normal   Assessment/Plan:  68 y.o. female for annual exam   1. Well female exam with routine gynecological exam Normal gynecologic exam in menopause.  Pap test August 2018 was negative, no indication to repeat this year.  Breast exam normal.  Last mammogram  in 2019, will obtain from [facility].  Colonoscopy 7 years ago.  Health labs with family physician.  2. Postmenopause Menopause, well on no hormone replacement therapy.  No postmenopausal bleeding.  3. Screening for osteoporosis Will schedule bone density here now.  Vitamin D supplements, calcium intake of 1200 to 1500 mg daily and regular weightbearing physical activity recommended - DG Bone Density; Future  Princess Bruins MD, 10:18 AM 01/06/2019

## 2019-01-08 ENCOUNTER — Encounter: Payer: Self-pay | Admitting: Obstetrics & Gynecology

## 2019-01-08 NOTE — Patient Instructions (Signed)
1. Well female exam with routine gynecological exam Normal gynecologic exam in menopause.  Pap test August 2018 was negative, no indication to repeat this year.  Breast exam normal.  Last mammogram in 2019, will obtain from [facility].  Colonoscopy 7 years ago.  Health labs with family physician.  2. Postmenopause Menopause, well on no hormone replacement therapy.  No postmenopausal bleeding.  3. Screening for osteoporosis Will schedule bone density here now.  Vitamin D supplements, calcium intake of 1200 to 1500 mg daily and regular weightbearing physical activity recommended - DG Bone Density; Future  Monica Fowler, it was a pleasure seeing you today!

## 2019-01-27 DIAGNOSIS — E890 Postprocedural hypothyroidism: Secondary | ICD-10-CM | POA: Diagnosis not present

## 2019-01-27 DIAGNOSIS — E7800 Pure hypercholesterolemia, unspecified: Secondary | ICD-10-CM | POA: Diagnosis not present

## 2019-02-01 DIAGNOSIS — L820 Inflamed seborrheic keratosis: Secondary | ICD-10-CM | POA: Diagnosis not present

## 2019-03-19 DIAGNOSIS — H40021 Open angle with borderline findings, high risk, right eye: Secondary | ICD-10-CM | POA: Diagnosis not present

## 2019-03-25 ENCOUNTER — Encounter: Payer: Self-pay | Admitting: Anesthesiology

## 2019-07-13 ENCOUNTER — Other Ambulatory Visit: Payer: Self-pay

## 2019-07-14 ENCOUNTER — Encounter: Payer: Self-pay | Admitting: Women's Health

## 2019-07-14 ENCOUNTER — Ambulatory Visit: Payer: PPO | Admitting: Women's Health

## 2019-07-14 VITALS — BP 122/78

## 2019-07-14 DIAGNOSIS — N764 Abscess of vulva: Secondary | ICD-10-CM

## 2019-07-14 DIAGNOSIS — L739 Follicular disorder, unspecified: Secondary | ICD-10-CM | POA: Diagnosis not present

## 2019-07-14 DIAGNOSIS — E039 Hypothyroidism, unspecified: Secondary | ICD-10-CM | POA: Insufficient documentation

## 2019-07-14 MED ORDER — CEPHALEXIN 500 MG PO CAPS: 500.0000 mg | ORAL_CAPSULE | Freq: Four times a day (QID) | ORAL | 0 refills | Status: DC

## 2019-07-14 MED ORDER — FLUCONAZOLE 150 MG PO TABS: 150.0000 mg | ORAL_TABLET | Freq: Once | ORAL | 0 refills | Status: AC

## 2019-07-14 NOTE — Progress Notes (Signed)
68 year old S WF G3, P1 presents with complaint of right labial swelling/bump for the past week.  States it is now about half the size that it was but it is still tender, firm, draining a small amount of yellow drainage.  Has been applying warm compresses, Neosporin topically.  Denies vaginal discharge, urinary symptoms, abdominal/back pain or fever.  Postmenopausal no HRT with no bleeding.  Not sexually active in years.  Medical problems include hypothyroidism, denies history of diabetes or altered blood sugars..  Exam: Appears well.  No CVAT.  Slim.  External genitalia right lower labia majora 3 cm indurated folliculitis/abscess with open area draining small amount of a white drainage.  Labial abscess  Plan: Options to open more or treat with by mouth antibiotics discussed.  We will treat with Keflex 500 mg 4 times daily for 7 days, continue warm compresses, sitz baths, loose clothing, open to air as able.  Diflucan 150 mg 1 dose to take after completing Keflex.  Instructed to return to office if not 100% resolved in 1 week.

## 2019-07-14 NOTE — Patient Instructions (Addendum)
Folliculitis  Folliculitis is inflammation of the hair follicles. Folliculitis most commonly occurs on the scalp, thighs, legs, back, and buttocks. However, it can occur anywhere on the body. What are the causes? This condition may be caused by:  A bacterial infection (common).  A fungal infection.  A viral infection.  Contact with certain chemicals, especially oils and tars.  Shaving or waxing.  Greasy ointments or creams applied to the skin. Long-lasting folliculitis and folliculitis that keeps coming back may be caused by bacteria. This bacteria can live anywhere on your skin and is often found in the nostrils. What increases the risk? You are more likely to develop this condition if you have:  A weakened immune system.  Diabetes.  Obesity. What are the signs or symptoms? Symptoms of this condition include:  Redness.  Soreness.  Swelling.  Itching.  Small white or yellow, pus-filled, itchy spots (pustules) that appear over a reddened area. If there is an infection that goes deep into the follicle, these may develop into a boil (furuncle).  A group of closely packed boils (carbuncle). These tend to form in hairy, sweaty areas of the body. How is this diagnosed? This condition is diagnosed with a skin exam. To find what is causing the condition, your health care provider may take a sample of one of the pustules or boils for testing in a lab. How is this treated? This condition may be treated by:  Applying warm compresses to the affected areas.  Taking an antibiotic medicine or applying an antibiotic medicine to the skin.  Applying or bathing with an antiseptic solution.  Taking an over-the-counter medicine to help with itching.  Having a procedure to drain any pustules or boils. This may be done if a pustule or boil contains a lot of pus or fluid.  Having laser hair removal. This may be done to treat long-lasting folliculitis. Follow these instructions at  home: Managing pain and swelling   If directed, apply heat to the affected area as often as told by your health care provider. Use the heat source that your health care provider recommends, such as a moist heat pack or a heating pad. ? Place a towel between your skin and the heat source. ? Leave the heat on for 20-30 minutes. ? Remove the heat if your skin turns bright red. This is especially important if you are unable to feel pain, heat, or cold. You may have a greater risk of getting burned. General instructions  If you were prescribed an antibiotic medicine, take it or apply it as told by your health care provider. Do not stop using the antibiotic even if your condition improves.  Check the irritated area every day for signs of infection. Check for: ? Redness, swelling, or pain. ? Fluid or blood. ? Warmth. ? Pus or a bad smell.  Do not shave irritated skin.  Take over-the-counter and prescription medicines only as told by your health care provider.  Keep all follow-up visits as told by your health care provider. This is important. Get help right away if:  You have more redness, swelling, or pain in the affected area.  Red streaks are spreading from the affected area.  You have a fever. Summary  Folliculitis is inflammation of the hair follicles. Folliculitis most commonly occurs on the scalp, thighs, legs, back, and buttocks.  This condition may be treated by taking an antibiotic medicine or applying an antibiotic medicine to the skin, and applying or bathing with an antiseptic   solution.  If you were prescribed an antibiotic medicine, take it or apply it as told by your health care provider. Do not stop using the antibiotic even if your condition improves.  Get help right away if you have new or worsening symptoms.  Keep all follow-up visits as told by your health care provider. This is important. This information is not intended to replace advice given to you by your  health care provider. Make sure you discuss any questions you have with your health care provider. Document Released: 11/18/2001 Document Revised: 04/18/2018 Document Reviewed: 04/18/2018 Elsevier Patient Education  2020 [employer].  Skin Abscess  A skin abscess is an infected area on or under your skin that contains a collection of pus and other material. An abscess may also be called a furuncle, carbuncle, or boil. An abscess can occur in or on almost any part of your body. Some abscesses break open (rupture) on their own. Most continue to get worse unless they are treated. The infection can spread deeper into the body and eventually into your blood, which can make you feel ill. Treatment usually involves draining the abscess. What are the causes? An abscess occurs when germs, like bacteria, pass through your skin and cause an infection. This may be caused by:  A scrape or cut on your skin.  A puncture wound through your skin, including a needle injection or insect bite.  Blocked oil or sweat glands.  Blocked and infected hair follicles.  A cyst that forms beneath your skin (sebaceous cyst) and becomes infected. What increases the risk? This condition is more likely to develop in people who:  Have a weak body defense system (immune system).  Have diabetes.  Have dry and irritated skin.  Get frequent injections or use illegal IV drugs.  Have a foreign body in a wound, such as a splinter.  Have problems with their lymph system or veins. What are the signs or symptoms? Symptoms of this condition include:  A painful, firm bump under the skin.  A bump with pus at the top. This may break through the skin and drain. Other symptoms include:  Redness surrounding the abscess site.  Warmth.  Swelling of the lymph nodes (glands) near the abscess.  Tenderness.  A sore on the skin. How is this diagnosed? This condition may be diagnosed based on:  A physical exam.  Your  medical history.  A sample of pus. This may be used to find out what is causing the infection.  Blood tests.  Imaging tests, such as an ultrasound, CT scan, or MRI. How is this treated? A small abscess that drains on its own may not need treatment. Treatment for larger abscesses may include:  Moist heat or heat pack applied to the area several times a day.  A procedure to drain the abscess (incision and drainage).  Antibiotic medicines. For a severe abscess, you may first get antibiotics through an IV and then change to antibiotics by mouth. Follow these instructions at home: Medicines   Take over-the-counter and prescription medicines only as told by your health care provider.  If you were prescribed an antibiotic medicine, take it as told by your health care provider. Do not stop taking the antibiotic even if you start to feel better. Abscess care   If you have an abscess that has not drained, apply heat to the affected area. Use the heat source that your health care provider recommends, such as a moist heat pack or a  heating pad. ? Place a towel between your skin and the heat source. ? Leave the heat on for 20-30 minutes. ? Remove the heat if your skin turns bright red. This is especially important if you are unable to feel pain, heat, or cold. You may have a greater risk of getting burned.  Follow instructions from your health care provider about how to take care of your abscess. Make sure you: ? Cover the abscess with a bandage (dressing). ? Change your dressing or gauze as told by your health care provider. ? Wash your hands with soap and water before you change the dressing or gauze. If soap and water are not available, use hand sanitizer.  Check your abscess every day for signs of a worsening infection. Check for: ? More redness, swelling, or pain. ? More fluid or blood. ? Warmth. ? More pus or a bad smell. General instructions  To avoid spreading the infection: ?  Do not share personal care items, towels, or hot tubs with others. ? Avoid making skin contact with other people.  Keep all follow-up visits as told by your health care provider. This is important. Contact a health care provider if you have:  More redness, swelling, or pain around your abscess.  More fluid or blood coming from your abscess.  Warm skin around your abscess.  More pus or a bad smell coming from your abscess.  A fever.  Muscle aches.  Chills or a general ill feeling. Get help right away if you:  Have severe pain.  See red streaks on your skin spreading away from the abscess. Summary  A skin abscess is an infected area on or under your skin that contains a collection of pus and other material.  A small abscess that drains on its own may not need treatment.  Treatment for larger abscesses may include having a procedure to drain the abscess and taking an antibiotic. This information is not intended to replace advice given to you by your health care provider. Make sure you discuss any questions you have with your health care provider. Document Released: 06/19/2005 Document Revised: 12/31/2018 Document Reviewed: 10/23/2017 Elsevier Patient Education  2020 [employer].

## 2019-10-14 ENCOUNTER — Ambulatory Visit: Payer: 59 | Attending: Internal Medicine

## 2019-10-14 DIAGNOSIS — Z23 Encounter for immunization: Secondary | ICD-10-CM | POA: Insufficient documentation

## 2019-10-14 NOTE — Progress Notes (Signed)
   Covid-19 Vaccination Clinic  Name:  Monica Fowler    MRN: [id number] DOB: 1951-01-21  10/14/2019  [name] was observed post Covid-19 immunization for 15 minutes without incidence. She was provided with Vaccine Information Sheet and instruction to access the V-Safe system.   [name] was instructed to call 911 with any severe reactions post vaccine: Marland Kitchen Difficulty breathing  . Swelling of your face and throat  . A fast heartbeat  . A bad rash all over your body  . Dizziness and weakness    Immunizations Administered    Name Date Dose VIS Date Route   Pfizer COVID-19 Vaccine 10/14/2019  8:15 AM 0.3 mL 09/03/2019 Intramuscular   Manufacturer: [place]   Lot: [id number]   [place]: [id number]

## 2019-10-31 ENCOUNTER — Ambulatory Visit: Payer: 59 | Attending: Internal Medicine

## 2019-10-31 DIAGNOSIS — Z23 Encounter for immunization: Secondary | ICD-10-CM | POA: Insufficient documentation

## 2019-10-31 NOTE — Progress Notes (Signed)
   Covid-19 Vaccination Clinic  Name:  Monica Fowler    MRN: [id number] DOB: June 21, 1951  10/31/2019  [name] was observed post Covid-19 immunization for 15 minutes without incidence. She was provided with Vaccine Information Sheet and instruction to access the V-Safe system.   [name] was instructed to call 911 with any severe reactions post vaccine: Marland Kitchen Difficulty breathing  . Swelling of your face and throat  . A fast heartbeat  . A bad rash all over your body  . Dizziness and weakness    Immunizations Administered    Name Date Dose VIS Date Route   Pfizer COVID-19 Vaccine 10/31/2019  4:14 PM 0.3 mL 09/03/2019 Intramuscular   Manufacturer: [place]   Lot: [id number]   [place]: [id number]

## 2019-11-02 ENCOUNTER — Ambulatory Visit: Payer: 59

## 2019-12-31 DIAGNOSIS — Z1389 Encounter for screening for other disorder: Secondary | ICD-10-CM | POA: Diagnosis not present

## 2019-12-31 DIAGNOSIS — E890 Postprocedural hypothyroidism: Secondary | ICD-10-CM | POA: Diagnosis not present

## 2019-12-31 DIAGNOSIS — R030 Elevated blood-pressure reading, without diagnosis of hypertension: Secondary | ICD-10-CM | POA: Diagnosis not present

## 2019-12-31 DIAGNOSIS — Z1211 Encounter for screening for malignant neoplasm of colon: Secondary | ICD-10-CM | POA: Diagnosis not present

## 2019-12-31 DIAGNOSIS — E7800 Pure hypercholesterolemia, unspecified: Secondary | ICD-10-CM | POA: Diagnosis not present

## 2019-12-31 DIAGNOSIS — M85859 Other specified disorders of bone density and structure, unspecified thigh: Secondary | ICD-10-CM | POA: Diagnosis not present

## 2019-12-31 DIAGNOSIS — Z0000 Encounter for general adult medical examination without abnormal findings: Secondary | ICD-10-CM | POA: Diagnosis not present

## 2020-01-19 DIAGNOSIS — R030 Elevated blood-pressure reading, without diagnosis of hypertension: Secondary | ICD-10-CM | POA: Diagnosis not present

## 2020-01-20 ENCOUNTER — Ambulatory Visit (INDEPENDENT_AMBULATORY_CARE_PROVIDER_SITE_OTHER): Payer: PPO | Admitting: Obstetrics & Gynecology

## 2020-01-20 ENCOUNTER — Other Ambulatory Visit: Payer: Self-pay

## 2020-01-20 ENCOUNTER — Encounter: Payer: Self-pay | Admitting: Obstetrics & Gynecology

## 2020-01-20 VITALS — BP 124/78 | Ht 64.0 in | Wt 115.0 lb

## 2020-01-20 DIAGNOSIS — M85851 Other specified disorders of bone density and structure, right thigh: Secondary | ICD-10-CM

## 2020-01-20 DIAGNOSIS — Z01419 Encounter for gynecological examination (general) (routine) without abnormal findings: Secondary | ICD-10-CM

## 2020-01-20 DIAGNOSIS — Z780 Asymptomatic menopausal state: Secondary | ICD-10-CM

## 2020-01-20 DIAGNOSIS — M8588 Other specified disorders of bone density and structure, other site: Secondary | ICD-10-CM

## 2020-01-20 NOTE — Progress Notes (Signed)
DELILIAH BOTTS 1951-09-08 [id number]   History:    69 y.o. G3P1A2L1 Retired.  RP:  Established patient presenting for annual gyn exam   HPI: Postmenopausal on no hormone replacement therapy.  No postmenopausal bleeding.  No pelvic pain.  Abstinent.  Urine and bowel movements normal.  Breasts normal.  Body mass index at 19.74.  Physically active.  Health labs with family physician.  Hemocults done with Fam MD.  Bone Density Osteopenia 03/2019.   Past medical history,surgical history, family history and social history were all reviewed and documented in the EPIC chart.  Gynecologic History No LMP recorded. Patient is postmenopausal.  Obstetric History OB History  Gravida Para Term Preterm AB Living  3 1     2 1   SAB TAB Ectopic Multiple Live Births  2            # Outcome Date GA Lbr Len/2nd Weight Sex Delivery Anes PTL Lv  3 SAB           2 SAB           1 Para              ROS: A ROS was performed and pertinent positives and negatives are included in the history.  GENERAL: No fevers or chills. HEENT: No change in vision, no earache, sore throat or sinus congestion. NECK: No pain or stiffness. CARDIOVASCULAR: No chest pain or pressure. No palpitations. PULMONARY: No shortness of breath, cough or wheeze. GASTROINTESTINAL: No abdominal pain, nausea, vomiting or diarrhea, melena or bright red blood per rectum. GENITOURINARY: No urinary frequency, urgency, hesitancy or dysuria. MUSCULOSKELETAL: No joint or muscle pain, no back pain, no recent trauma. DERMATOLOGIC: No rash, no itching, no lesions. ENDOCRINE: No polyuria, polydipsia, no heat or cold intolerance. No recent change in weight. HEMATOLOGICAL: No anemia or easy bruising or bleeding. NEUROLOGIC: No headache, seizures, numbness, tingling or weakness. PSYCHIATRIC: No depression, no loss of interest in normal activity or change in sleep pattern.     Exam:   BP 124/78 (BP Location: Right Arm, Patient Position: Sitting,  Cuff Size: Normal)   Ht 5\' 4"  (1.626 m)   Wt 115 lb (52.2 kg)   BMI 19.74 kg/m   Body mass index is 19.74 kg/m.  General appearance : Well developed well nourished female. No acute distress HEENT: Eyes: no retinal hemorrhage or exudates,  Neck supple, trachea midline, no carotid bruits, no thyroidmegaly Lungs: Clear to auscultation, no rhonchi or wheezes, or rib retractions  Heart: Regular rate and rhythm, no murmurs or gallops Breast:Examined in sitting and supine position were symmetrical in appearance, no palpable masses or tenderness,  no skin retraction, no nipple inversion, no nipple discharge, no skin discoloration, no axillary or supraclavicular lymphadenopathy Abdomen: no palpable masses or tenderness, no rebound or guarding Extremities: no edema or skin discoloration or tenderness  Pelvic: Vulva: Normal             Vagina: No gross lesions or discharge  Cervix: No gross lesions or discharge  Uterus  RV, normal size, shape and consistency, non-tender and mobile  Adnexa  Without masses or tenderness  Anus: Normal   Assessment/Plan:  69 y.o. female for annual exam   1. Well female exam with routine gynecological exam Normal gynecologic exam in menopause.  Pap test negative in 2018, no indication to repeat a Pap test this year.  Breast exam normal.  Screening mammogram to schedule.  Hemoccults done this year.  Health labs  with family physician.  Body mass index 19.74.  Continue with fitness and healthy nutrition.  2. Postmenopause Well on no hormone replacement therapy.  No postmenopausal bleeding.  3. Osteopenia of right femur Osteopenia on bone density July 2020 with a T score of -1.6 at the right femoral neck.  Vitamin D supplements, calcium intake of 1200 mg daily and regular weightbearing physical activities to continue.  Princess Bruins MD, 2:12 PM 01/20/2020

## 2020-01-20 NOTE — Patient Instructions (Signed)
1. Well female exam with routine gynecological exam Normal gynecologic exam in menopause.  Pap test negative in 2018, no indication to repeat a Pap test this year.  Breast exam normal.  Screening mammogram to schedule.  Hemoccults done this year.  Health labs with family physician.  Body mass index 19.74.  Continue with fitness and healthy nutrition.  2. Postmenopause Well on no hormone replacement therapy.  No postmenopausal bleeding.  3. Osteopenia of right femur Osteopenia on bone density July 2020 with a T score of -1.6 at the right femoral neck.  Vitamin D supplements, calcium intake of 1200 mg daily and regular weightbearing physical activities to continue.  Sauda, it was a pleasure seeing you today!

## 2020-01-21 DIAGNOSIS — R030 Elevated blood-pressure reading, without diagnosis of hypertension: Secondary | ICD-10-CM | POA: Diagnosis not present

## 2020-02-09 DIAGNOSIS — Z1231 Encounter for screening mammogram for malignant neoplasm of breast: Secondary | ICD-10-CM | POA: Diagnosis not present

## 2020-02-18 DIAGNOSIS — R030 Elevated blood-pressure reading, without diagnosis of hypertension: Secondary | ICD-10-CM | POA: Diagnosis not present

## 2020-03-22 DIAGNOSIS — D225 Melanocytic nevi of trunk: Secondary | ICD-10-CM | POA: Diagnosis not present

## 2020-03-22 DIAGNOSIS — L819 Disorder of pigmentation, unspecified: Secondary | ICD-10-CM | POA: Diagnosis not present

## 2020-03-22 DIAGNOSIS — L814 Other melanin hyperpigmentation: Secondary | ICD-10-CM | POA: Diagnosis not present

## 2020-03-22 DIAGNOSIS — L578 Other skin changes due to chronic exposure to nonionizing radiation: Secondary | ICD-10-CM | POA: Diagnosis not present

## 2020-03-22 DIAGNOSIS — L821 Other seborrheic keratosis: Secondary | ICD-10-CM | POA: Diagnosis not present

## 2021-01-01 DIAGNOSIS — E7800 Pure hypercholesterolemia, unspecified: Secondary | ICD-10-CM | POA: Diagnosis not present

## 2021-01-01 DIAGNOSIS — R030 Elevated blood-pressure reading, without diagnosis of hypertension: Secondary | ICD-10-CM | POA: Diagnosis not present

## 2021-01-01 DIAGNOSIS — R413 Other amnesia: Secondary | ICD-10-CM | POA: Diagnosis not present

## 2021-01-01 DIAGNOSIS — Z0000 Encounter for general adult medical examination without abnormal findings: Secondary | ICD-10-CM | POA: Diagnosis not present

## 2021-01-01 DIAGNOSIS — R000 Tachycardia, unspecified: Secondary | ICD-10-CM | POA: Diagnosis not present

## 2021-01-01 DIAGNOSIS — E890 Postprocedural hypothyroidism: Secondary | ICD-10-CM | POA: Diagnosis not present

## 2021-01-04 ENCOUNTER — Other Ambulatory Visit: Payer: Self-pay | Admitting: Internal Medicine

## 2021-01-04 DIAGNOSIS — R413 Other amnesia: Secondary | ICD-10-CM

## 2021-01-22 ENCOUNTER — Encounter: Payer: PPO | Admitting: Obstetrics & Gynecology

## 2021-01-31 ENCOUNTER — Encounter: Payer: PPO | Admitting: Obstetrics & Gynecology

## 2021-01-31 DIAGNOSIS — Z0289 Encounter for other administrative examinations: Secondary | ICD-10-CM

## 2021-02-02 ENCOUNTER — Other Ambulatory Visit: Payer: Self-pay

## 2021-02-02 ENCOUNTER — Ambulatory Visit
Admission: RE | Admit: 2021-02-02 | Discharge: 2021-02-02 | Disposition: A | Discharge status: Home / Self Care | Payer: PPO | Source: Ambulatory Visit | Attending: Internal Medicine | Admitting: Internal Medicine

## 2021-02-02 DIAGNOSIS — R413 Other amnesia: Secondary | ICD-10-CM | POA: Diagnosis not present

## 2021-02-02 DIAGNOSIS — R030 Elevated blood-pressure reading, without diagnosis of hypertension: Secondary | ICD-10-CM | POA: Diagnosis not present

## 2021-02-02 DIAGNOSIS — G319 Degenerative disease of nervous system, unspecified: Secondary | ICD-10-CM | POA: Diagnosis not present

## 2021-02-02 DIAGNOSIS — E890 Postprocedural hypothyroidism: Secondary | ICD-10-CM | POA: Diagnosis not present

## 2021-02-02 DIAGNOSIS — R634 Abnormal weight loss: Secondary | ICD-10-CM | POA: Diagnosis not present

## 2021-02-02 DIAGNOSIS — R4189 Other symptoms and signs involving cognitive functions and awareness: Secondary | ICD-10-CM | POA: Diagnosis not present

## 2021-02-02 MED ORDER — GADOBENATE DIMEGLUMINE 529 MG/ML IV SOLN
10.0000 mL | Freq: Once | INTRAVENOUS | Status: AC | PRN
  Administered 2021-02-02: 10 mL via INTRAVENOUS

## 2021-03-09 DIAGNOSIS — R413 Other amnesia: Secondary | ICD-10-CM | POA: Diagnosis not present

## 2021-03-09 DIAGNOSIS — R634 Abnormal weight loss: Secondary | ICD-10-CM | POA: Diagnosis not present

## 2021-03-09 DIAGNOSIS — E890 Postprocedural hypothyroidism: Secondary | ICD-10-CM | POA: Diagnosis not present

## 2021-03-16 ENCOUNTER — Other Ambulatory Visit: Payer: Self-pay

## 2021-03-16 ENCOUNTER — Ambulatory Visit: Payer: PPO | Admitting: Physician Assistant

## 2021-03-16 ENCOUNTER — Encounter: Payer: Self-pay | Admitting: Physician Assistant

## 2021-03-16 VITALS — BP 151/97 | HR 98 | Ht 66.0 in | Wt 109.6 lb

## 2021-03-16 DIAGNOSIS — R413 Other amnesia: Secondary | ICD-10-CM | POA: Diagnosis not present

## 2021-03-16 MED ORDER — DONEPEZIL HCL 10 MG PO TABS: 10.0000 mg | ORAL_TABLET | Freq: Every day | ORAL | 11 refills | Status: DC

## 2021-03-16 NOTE — Progress Notes (Addendum)
Assessment/Plan:   Monica Fowler is a 70 y.o. year old female with risk factors including age, postablative hypothyroidism, hypertension, hyperlipidemia diet controlled, seen today for evaluation of memory loss.  MoCA today is 6/30 ( equivalent of MMSE of 12), with notable deficiencies in visuospatial -executive, naming, attention, language, abstraction, and delayed recall.  Mild orientation deficiency, with date, month or year.  MRI of the brain moderate cerebral atrophy without any other acute abnormality. TSH is normal, and being controlled by PCP.  B12 slightly low. Findings are concerning for dementia.Will proceed with further workup.   Memory Loss  Neurocognitive testing to further evaluated cognitive concerns Start Donepezil 10 mg:  half tablet (5 mg) daily for 2 weeks, then increase to the full tablet at 10 mg daily . Side effects discussed Start B12 supplements 1000 mcg daily  to maintain B12 at 400 or above Discussed safety both in and out of the home.  Discussed the importance of regular daily schedule with inclusion of crossword puzzles to maintain brain function.  Continue to monitor mood with PCP.  Stay active at least 30 minutes at least 3 times a week.  Mediterranean diet is recommended  Folllow up once results above are available   Subjective:    The patient is seen in neurologic consultation at the request of Lavone Orn, MD for the evaluation of memory loss.  The patient is accompanied by son [name] who supplements the history.  The patient is a 70 y.o. 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  She would laugh about not knowing how to deal with new technology, or how the remote control would work.  About 1 year ago, the patient began to notice a decline, especially with short-term memory, especially with remembering appointments, new names, or birthdays.  About 6 months ago, the patient had to  start writing on a daily basis things that she wants to remember, because "it will fade rapidly.  She notices decrease in the ability to process new information, or dealing with new situations.  Her son adds that this "increases her anxiety, makes her be tearful and at times irritable because she is to be an incredibly independent person, and now she has problems dealing with simpler tasks". Her son states that he had difficulty seeing his mother performance during the MoCA test, taking so much time to understand steps.  Denies living objects in unusual places.  She denies hallucinations or paranoia. The patient lives alone, but has support of family and friends.  Her son [name] visits her very frequently, and has a good relationship with her.  She likes to remain active, does a lot of yoga and tai chi to maintain balance.  She likes to walk frequently.  She denies needing a walker or a cane.  She denies becoming lost when walking.  She sleeps well, denies vivid dreams or sleepwalking.  She is independent of bathing and dressing.  She admits to missing her medications frequently, and uses a pillbox.  She cooks and denies leaving the stove on or the faucet on.  Her appetite is very good, despite unintentional weight loss which she attributes to her thyroid disease.  She denies any trouble swallowing.   She continues to drive with GPS.  She denies getting lost, and is afraid of driving long distances. She does her own finances "and still very good at it "and does not miss  any payments or over pays. Denies headaches, trauma, or injuries to the head, double vision, dizziness, focal numbness or tingling, unilateral weakness or tremors. Denies urine incontinence or retention. Denies constipation or diarrhea. Denies history of OSA, she drinks 1  glass of wine a day during the female.  She is a former tobacco user quitting in 2009 a 20-pack-year.  She has a family history remarkable for mother with dementia, possibly  Alzheimer's.  Available pertinent labs:    TSH .781 (03/09/21) T4 elevated (52022), on Synthroid  CBC normal Lipid panel remarkable for LDL 152, TC 264 o/w normal CMP normal B12 on 12/2020 was 319  MRI brain 02/02/21 w and wo contrast moderate cerebral atrophy without any other acute abnormality    Allergies  Allergen Reactions   Codeine Nausea And Vomiting    Current Outpatient Medications  Medication Instructions   donepezil (ARICEPT) 10 mg, Oral, Daily, Take half tablet (5 mg) daily for 2 weeks, then increase to the full tablet at 10 mg daily   levothyroxine (SYNTHROID) 137 mcg, Oral, Daily before breakfast     VITALS:   Vitals:   03/16/21 1029  BP: (!) 151/97  Pulse: 98  SpO2: 97%  Weight: 109 lb 9.6 oz (49.7 kg)  Height: 5' 6"  (1.676 m)   No flowsheet data found.  HEENT:  Normocephalic, atraumatic. The mucous membranes are moist. The superficial temporal arteries are without ropiness or tenderness. Cardiovascular: Regular rate and rhythm. Lungs: Clear to auscultation bilaterally. Neck: There are no carotid bruits noted bilaterally.  NEUROLOGICAL:  Orientation:   Montreal Cognitive Assessment  03/16/2021  Visuospatial/ Executive (0/5) 1  Naming (0/3) 0  Attention: Read list of digits (0/2) 1  Attention: Read list of letters (0/1) 0  Attention: Serial 7 subtraction starting at 100 (0/3) 0  Language: Repeat phrase (0/2) 0  Language : Fluency (0/1) 0  Abstraction (0/2) 0  Delayed Recall (0/5) 0  Orientation (0/6) 4  Total 6  Adjusted Score (based on education) 6   Alert and oriented to person, place and date, not to month or year. No aphasia or dysarthria. Fund of knowledge is reduced. Recent and remote memory are impaired.  Attention and concentration are reduced  Unable to name animals and repeat phrases.Delayed recall 0/5.  Cranial nerves: There is good facial symmetry. Extraocular muscles are intact and visual fields are full to confrontational testing.  Speech is fluent and clear. Soft palate rises symmetrically and there is no tongue deviation. Hearing is intact to conversational tone. Tone: Tone is good throughout. Sensation: Sensation is intact to light touch and pinprick throughout. Vibration is intact at the bilateral big toe.There is no extinction with double simultaneous stimulation. There is no sensory dermatomal level identified. Coordination: The patient has no difficulty with RAM's or FNF bilaterally but needed to repeat the instructions several times till patient would understand. Normal finger to nose  Motor: Strength is 5/5 in the bilateral upper and lower extremities. There is no pronator drift. There are no fasciculations noted. DTR's: Deep tendon reflexes are 2/4 at the bilateral biceps, triceps, brachioradialis, patella and achilles.  Plantar responses are downgoing bilaterally. Gait and Station: The patient is able to ambulate without difficulty.The patient is able to heel toe walk without any difficulty.The patient is able to ambulate in a tandem fashion. The patient is able to stand in the Romberg position.   No flowsheet data found.   No flowsheet data found.    Thank you for allowing [place]  the opportunity to participate in the care of this nice patient. Please do not hesitate to contact us for any questions or concerns.   Total time spent on today's visit was  60 minutes, including both face-to-face time and nonface-to-face time.  Time included that spent on review of records (prior notes available to me/labs/imaging if pertinent), discussing treatment and goals, answering patient's questions and coordinating care.  Cc:  Lavone Orn, MD  Sharene Butters 03/16/2021 12:34 PM

## 2021-03-16 NOTE — Patient Instructions (Addendum)
It was a pleasure to see you today at our office.   Recommendations:  Neurocognitive evaluation at our office  Start B12 supplements to 1000 micrograms a day to keep the value above 400  We will start donepezil half tablet (5mg ) daily for 2  weeks.  If you are tolerating the medication, then after 2 weeks, we will increase the dose to a full tablet of 10mg  daily.  Side effects include nausea, vomiting, diarrhea, vivid dreams, and muscle cramps.  Please call the clinic if you experience any of these symptoms.  Follow up after Neurocognitive exam  RECOMMENDATIONS FOR ALL PATIENTS WITH MEMORY PROBLEMS: 1. Continue to exercise (Recommend 30 minutes of walking everyday, or 3 hours every week) 2. Increase social interactions - continue going to [place] and enjoy social gatherings with friends and family 3. Eat healthy, avoid fried foods and eat more fruits and vegetables 4. Maintain adequate blood pressure, blood sugar, and blood cholesterol level. Reducing the risk of stroke and cardiovascular disease also helps promoting better memory. 5. Avoid stressful situations. Live a simple life and avoid aggravations. Organize your time and prepare for the next day in anticipation. 6. Sleep well, avoid any interruptions of sleep and avoid any distractions in the bedroom that may interfere with adequate sleep quality 7. Avoid sugar, avoid sweets as there is a strong link between excessive sugar intake, diabetes, and cognitive impairment We discussed the Mediterranean diet, which has been shown to help patients reduce the risk of progressive memory disorders and reduces cardiovascular risk. This includes eating fish, eat fruits and green leafy vegetables, nuts like almonds and hazelnuts, walnuts, and also use olive oil. Avoid fast foods and fried foods as much as possible. Avoid sweets and sugar as sugar use has been linked to worsening of memory function.  There is always a concern of gradual progression of  memory problems. If this is the case, then we may need to adjust level of care according to patient needs. Support, both to the patient and caregiver, should then be put into place.      You have been referred for a neuropsychological evaluation (i.e., evaluation of memory and thinking abilities). Please bring someone with you to this appointment if possible, as it is helpful for the doctor to hear from both you and another adult who knows you well. Please bring eyeglasses and hearing aids if you wear them.    The evaluation will take approximately 3 hours and has two parts:   The first part is a clinical interview with the neuropsychologist (Dr. Melvyn Novas or Dr. Nicole Kindred). During the interview, the neuropsychologist will speak with you and the individual you brought to the appointment.    The second part of the evaluation is testing with the doctor's technician Hinton Dyer or Maudie Mercury). During the testing, the technician will ask you to remember different types of material, solve problems, and answer some questionnaires. Your family member will not be present for this portion of the evaluation.   Please note: We must reserve several hours of the neuropsychologist's time and the psychometrician's time for your evaluation appointment. As such, there is a No-Show fee of $100. If you are unable to attend any of your appointments, please contact our office as soon as possible to reschedule.    FALL PRECAUTIONS: Be cautious when walking. Scan the area for obstacles that may increase the risk of trips and falls. When getting up in the mornings, sit up at the edge of the bed for a  few minutes before getting out of bed. Consider elevating the bed at the head end to avoid drop of blood pressure when getting up. Walk always in a well-lit room (use night lights in the walls). Avoid area rugs or power cords from appliances in the middle of the walkways. Use a walker or a cane if necessary and consider physical therapy for  balance exercise. Get your eyesight checked regularly.  FINANCIAL OVERSIGHT: Supervision, especially oversight when making financial decisions or transactions is also recommended.  HOME SAFETY: Consider the safety of the kitchen when operating appliances like stoves, microwave oven, and blender. Consider having supervision and share cooking responsibilities until no longer able to participate in those. Accidents with firearms and other hazards in the house should be identified and addressed as well.   ABILITY TO BE LEFT ALONE: If patient is unable to contact 911 operator, consider using LifeLine, or when the need is there, arrange for someone to stay with patients. Smoking is a fire hazard, consider supervision or cessation. Risk of wandering should be assessed by caregiver and if detected at any point, supervision and safe proof recommendations should be instituted.  MEDICATION SUPERVISION: Inability to self-administer medication needs to be constantly addressed. Implement a mechanism to ensure safe administration of the medications.   DRIVING: Regarding driving, in patients with progressive memory problems, driving will be impaired. We advise to have someone else do the driving if trouble finding directions or if minor accidents are reported. Independent driving assessment is available to determine safety of driving.   If you are interested in the driving assessment, you can contact the following:  [facility] [employer] in [place]  [place] [phone] or [phone]    [place] refers to food and lifestyle choices that are based on the traditions of countries located on the [employer]. This way of eating has been shown to help prevent certain conditions and improve outcomes for people who have chronic diseases, like kidney disease and heart  disease. What are tips for following this plan? Lifestyle  Cook and eat meals together with your family, when possible. Drink enough fluid to keep your urine clear or pale yellow. Be physically active every day. This includes: Aerobic exercise like running or swimming. Leisure activities like gardening, walking, or housework. Get 7-8 hours of sleep each night. If recommended by your health care provider, drink red wine in moderation. This means 1 glass a day for nonpregnant women and 2 glasses a day for men. A glass of wine equals 5 oz (150 mL). Reading food labels  Check the serving size of packaged foods. For foods such as rice and pasta, the serving size refers to the amount of cooked product, not dry. Check the total fat in packaged foods. Avoid foods that have saturated fat or trans fats. Check the ingredients list for added sugars, such as corn syrup. Shopping  At the grocery store, buy most of your food from the areas near the walls of the store. This includes: Fresh fruits and vegetables (produce). Grains, beans, nuts, and seeds. Some of these may be available in unpackaged forms or large amounts (in bulk). Fresh seafood. Poultry and eggs. Low-fat dairy products. Buy whole ingredients instead of prepackaged foods. Buy fresh fruits and vegetables in-season from local farmers markets. Buy frozen fruits and vegetables in resealable bags. If you do not have access to quality fresh seafood, buy precooked frozen  shrimp or canned fish, such as tuna, salmon, or sardines. Buy small amounts of raw or cooked vegetables, salads, or olives from the deli or salad bar at your store. Stock your pantry so you always have certain foods on hand, such as olive oil, canned tuna, canned tomatoes, rice, pasta, and beans. Cooking  Cook foods with extra-virgin olive oil instead of using butter or other vegetable oils. Have meat as a side dish, and have vegetables or grains as your main dish. This means  having meat in small portions or adding small amounts of meat to foods like pasta or stew. Use beans or vegetables instead of meat in common dishes like chili or lasagna. Experiment with different cooking methods. Try roasting or broiling vegetables instead of steaming or sauteing them. Add frozen vegetables to soups, stews, pasta, or rice. Add nuts or seeds for added healthy fat at each meal. You can add these to yogurt, salads, or vegetable dishes. Marinate fish or vegetables using olive oil, lemon juice, garlic, and fresh herbs. Meal planning  Plan to eat 1 vegetarian meal one day each week. Try to work up to 2 vegetarian meals, if possible. Eat seafood 2 or more times a week. Have healthy snacks readily available, such as: Vegetable sticks with hummus. Greek yogurt. Fruit and nut trail mix. Eat balanced meals throughout the week. This includes: Fruit: 2-3 servings a day Vegetables: 4-5 servings a day Low-fat dairy: 2 servings a day Fish, poultry, or lean meat: 1 serving a day Beans and legumes: 2 or more servings a week Nuts and seeds: 1-2 servings a day Whole grains: 6-8 servings a day Extra-virgin olive oil: 3-4 servings a day Limit red meat and sweets to only a few servings a month What are my food choices? Mediterranean diet Recommended Grains: Whole-grain pasta. Brown rice. Bulgar wheat. Polenta. Couscous. Whole-wheat bread. [place]. Vegetables: Artichokes. Beets. Broccoli. Cabbage. Carrots. Eggplant. Green beans. Chard. Kale. Spinach. Onions. Leeks. Peas. Squash. Tomatoes. Peppers. Radishes. Fruits: Apples. Apricots. Avocado. Berries. Bananas. Cherries. Dates. Figs. Grapes. Lemons. Melon. Oranges. Peaches. Plums. Pomegranate. Meats and other protein foods: Beans. Almonds. Sunflower seeds. Pine nuts. Peanuts. [place]. Salmon. Scallops. Shrimp. [place]. Tilapia. Clams. Oysters. Eggs. Dairy: Low-fat milk. Cheese. Greek yogurt. Beverages: Water. Red wine. Herbal tea. Fats and  oils: Extra virgin olive oil. Avocado oil. Grape seed oil. Sweets and desserts: [place] yogurt with honey. Baked apples. Poached pears. Trail mix. Seasoning and other foods: Basil. Cilantro. Coriander. Cumin. Mint. Parsley. Sage. Rosemary. Tarragon. Garlic. Oregano. Thyme. Pepper. Balsalmic vinegar. Tahini. Hummus. Tomato sauce. Olives. Mushrooms. Limit these Grains: Prepackaged pasta or rice dishes. Prepackaged cereal with added sugar. Vegetables: Deep fried potatoes (french fries). Fruits: Fruit canned in syrup. Meats and other protein foods: Beef. Pork. Lamb. Poultry with skin. Hot dogs. [place] Salines. Dairy: Ice cream. Sour cream. Whole milk. Beverages: Juice. Sugar-sweetened soft drinks. Beer. Liquor and spirits. Fats and oils: Butter. Canola oil. Vegetable oil. Beef fat (tallow). Lard. Sweets and desserts: Cookies. Cakes. Pies. Candy. Seasoning and other foods: Mayonnaise. Premade sauces and marinades. The items listed may not be a complete list. Talk with your dietitian about what dietary choices are right for you. Summary The Mediterranean diet includes both food and lifestyle choices. Eat a variety of fresh fruits and vegetables, beans, nuts, seeds, and whole grains. Limit the amount of red meat and sweets that you eat. Talk with your health care provider about whether it is safe for you to drink red wine in moderation. This means 1  glass a day for nonpregnant women and 2 glasses a day for men. A glass of wine equals 5 oz (150 mL). This information is not intended to replace advice given to you by your health care provider. Make sure you discuss any questions you have with your health care provider. Document Released: 05/02/2016 Document Revised: 06/04/2016 Document Reviewed: 05/02/2016 Elsevier Interactive Patient Education  2017 [employer].

## 2021-05-01 DIAGNOSIS — H25042 Posterior subcapsular polar age-related cataract, left eye: Secondary | ICD-10-CM | POA: Diagnosis not present

## 2021-05-01 DIAGNOSIS — H40021 Open angle with borderline findings, high risk, right eye: Secondary | ICD-10-CM | POA: Diagnosis not present

## 2021-05-01 DIAGNOSIS — H2513 Age-related nuclear cataract, bilateral: Secondary | ICD-10-CM | POA: Diagnosis not present

## 2021-07-05 DIAGNOSIS — R413 Other amnesia: Secondary | ICD-10-CM | POA: Diagnosis not present

## 2021-07-05 DIAGNOSIS — Z23 Encounter for immunization: Secondary | ICD-10-CM | POA: Diagnosis not present

## 2021-07-06 ENCOUNTER — Encounter: Payer: Self-pay | Admitting: Psychology

## 2021-07-06 ENCOUNTER — Ambulatory Visit (INDEPENDENT_AMBULATORY_CARE_PROVIDER_SITE_OTHER): Payer: PPO | Admitting: Psychology

## 2021-07-06 ENCOUNTER — Other Ambulatory Visit: Payer: Self-pay

## 2021-07-06 ENCOUNTER — Ambulatory Visit: Payer: PPO | Admitting: Psychology

## 2021-07-06 DIAGNOSIS — M6530 Trigger finger, unspecified finger: Secondary | ICD-10-CM | POA: Insufficient documentation

## 2021-07-06 DIAGNOSIS — R4189 Other symptoms and signs involving cognitive functions and awareness: Secondary | ICD-10-CM

## 2021-07-06 DIAGNOSIS — G309 Alzheimer's disease, unspecified: Secondary | ICD-10-CM

## 2021-07-06 DIAGNOSIS — E7800 Pure hypercholesterolemia, unspecified: Secondary | ICD-10-CM | POA: Insufficient documentation

## 2021-07-06 DIAGNOSIS — F0280 Dementia in other diseases classified elsewhere without behavioral disturbance: Secondary | ICD-10-CM | POA: Diagnosis not present

## 2021-07-06 HISTORY — DX: Dementia in other diseases classified elsewhere, unspecified severity, without behavioral disturbance, psychotic disturbance, mood disturbance, and anxiety: F02.80

## 2021-07-06 NOTE — Progress Notes (Signed)
   Psychometrician Note   Cognitive testing was administered to Monica Fowler by Monica Fowler, B.S. (psychometrist) under the supervision of Monica Fowler, Ph.D., licensed psychologist on 07/06/21. Monica Fowler did not appear overtly distressed by the testing session per behavioral observation or responses across self-report questionnaires. Rest breaks were offered.    The battery of tests administered was selected by Monica Fowler, Ph.D. with consideration to Monica Fowler current level of functioning, the nature of her symptoms, emotional and behavioral responses during interview, level of literacy, observed level of motivation/effort, and the nature of the referral question. This battery was communicated to the psychometrist. Communication between Monica Fowler, Ph.D. and the psychometrist was ongoing throughout the evaluation and Monica Fowler, Ph.D. was immediately accessible at all times. Monica Fowler, Ph.D. provided supervision to the psychometrist on the date of this service to the extent necessary to assure the quality of all services provided.    Monica Fowler will return within approximately 1-2 weeks for an interactive feedback session with Dr. Melvyn Novas at which time her test performances, clinical impressions, and treatment recommendations will be reviewed in detail. Monica Fowler understands she can contact our office should she require our assistance before this time.  A total of 105 minutes of billable time were spent face-to-face with Monica Fowler by the psychometrist. This includes both test administration and scoring time. Billing for these services is reflected in the clinical report generated by Monica Fowler, Ph.D.  This note reflects time spent with the psychometrician and does not include test scores or any clinical interpretations made by Dr. Melvyn Novas. The full report will follow in a separate note.

## 2021-07-06 NOTE — Progress Notes (Signed)
NEUROPSYCHOLOGICAL EVALUATION [facility] Neu[facility]  Date of Evaluation: July 06, 2021  Reason for Referral:   Monica Fowler is a 70 y.o. right-handed Caucasian female referred by  Sharene Butters, PA-C , to characterize her current cognitive functioning and assist with diagnostic clarity and treatment planning in the context of subjective cognitive decline and abnormal performance on a brief cognitive screening instrument ([place] = 6/30).   Assessment and Plan:   Clinical Impression(s): Monica Fowler reported anxiety during the testing process and eventually expressed a desire to discontinue prematurely. Encouragement was provided by the psychometrist to continue; however, testing was eventually abbreviated in response. This is despite her reporting minimal anxiety symptoms on a related questionnaire. Due to poor testing tolerance, several cognitive domains were unable to be adequately assessed or assessed at all. Clinical impressions are consequently somewhat limited.   Monica Fowler pattern of performance is suggestive of severe, diffuse cognitive impairment, with all assessed cognitive domains scoring in the exceptionally low to well below average normative ranges when compared to age-matched peers. Deficits in confrontation naming and retrieval/consolidation aspects of memory were particularly striking. However, generally speaking, no patterns of strength or weakness strongly emerged. [name] largely denied trouble completing instrumental activities of daily living (ADLs) independently. However, she previously reported to Monica Fowler that she would "frequently" forget to take medications. Her son has taken over financial management. While this was partly due to bills being placed on auto-draft and Monica Fowler not being technologically savvy, he remarked that she was previously paying bills with incorrect amounts. She drives locally; however,  both she and her son expressed concerns regarding her driving alone outside of her familiar surroundings. Overall, given the report of some ADL dysfunction, coupled with severe cognitive impairment, Monica Fowler meets diagnostic criteria for a Major Neurocognitive Disorder ("dementia") at the present time.  The etiology of her dysfunction is somewhat unclear given the presence of diffuse severe impairment without an observable pattern. Alzheimer's disease should be strongly considered. Going by population base rates, this disease process is far and away the most common neurodegenerative condition. Symptoms likely starting in her mid-60s would be somewhat early relative to the typical presentation of this illness, but certainly not outside of what is seen in clinical populations. Per her son's report, he reported that [name] will forget things stated to her very quickly. Across memory testing, she had significant trouble learning new information even with repeated exposure, was essentially amnestic after short delays, and performed very poorly across yes/no recognition trials. Overall, this suggests a significant memory storage impairment, which is the hallmark characteristic of this condition. Deficits in confrontation naming and verbal fluency (semantic worse than phonemic) would also be consistent with disease progression. I cannot rule out a more rare neurological disease process, such as a semantic dementia presentation, largely due to pronounced and unexpectedly severe impairment surrounding confrontation naming. However, there was evidence to suggest that Monica Fowler has some spared semantic knowledge. Imaging was also far more generalized than what may typically be seen in this presentation. As such, while I cannot rule this out, Alzheimer's disease appears more likely at the present time. While Monica Fowler attributed presumed weakness due to test anxiety, I do not believe that this is the case.  While anxiety can certainly influence cognitive abilities, her weaknesses are far more likely due to a true neurological disease process. Continued medical monitoring will be important moving forward.  Recommendations: Monica Fowler has already  been prescribed medication given to individuals with memory loss and concerns for Alzheimer's disease. She is encouraged to continue taking this medication as prescribed. It is important to highlight that this medication may slow down functional decline in some individuals. There is no current treatment which can stop or reverse cognitive decline.   Admittedly, performance across neurocognitive testing is not a strong predictor of an individual's safety operating a motor vehicle. However, I do have concerns surrounding her safety behind the wheel given the extent of ongoing cognitive impairment. I would recommend that she abstain from driving pending a formalized driving evaluation. Her and her family would be encouraged to contact [facility] in [place], [place] at [phone]. Another option would be through [facility] however, the latter would likely require a referral from a medical doctor. [facility] can be reached directly at [phone].   Should there be a progression of her current deficits over time, Monica Fowler is unlikely to regain any independent living skills lost. Therefore, it is recommended that she remain as involved as possible in all aspects of household chores, finances, and medication management, with supervision to ensure adequate performance. She will likely benefit from the establishment and maintenance of a routine in order to maximize her functional abilities over time.  Monica Fowler currently lives alone. It will be very important for her family to regularly assess her living arrangement and examine any safety concerns given the extent of ongoing impairment. Additionally, they may wish to discuss future plans  for caretaking and seek out community options for in home/residential care should they become necessary.  If not already done, she and her family may want to discuss her wishes regarding durable power of attorney and medical decision making, so that she can have input into these choices.  It will be important for Monica Fowler to have another person with her when in situations where she may need to process information, weigh the pros and cons of different options, and make decisions, in order to ensure that she fully understands and recalls all information to be considered.  Important information to remember should be provided in written formats in all instances. This should be placed in a highly visible and commonly frequented area of her residence to try and help promote recall.   To address problems with processing speed, she may wish to consider:   -Ensuring that she is alerted when essential material or instructions are being presented   -Adjusting the speed at which new information is presented   -Allowing for more time in comprehending, processing, and responding in conversation  To address problems with fluctuating attention, she may wish to consider:   -Avoiding external distractions when needing to concentrate   -Limiting exposure to fast paced environments with multiple sensory demands   -Writing down complicated information and using checklists   -Attempting and completing one task at a time (i.e., no multi-tasking)   -Verbalizing aloud each step of a task to maintain focus   -Reducing the amount of information considered at one time  Review of Records:   Monica Fowler was seen by [facility] Sharene Butters, PA-C) on 03/16/2021 for an evaluation of memory loss. 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 She reported laughing about not knowing how to deal with new technology or how to work the remote control. About one year  ago, she began to notice more pronounced decline, especially with short-term  memory. Examples included trouble remembering appointments, new names, or birthdays.  About six months ago, she noted having to start writing notes on a daily basis to help her remember because "it will fade rapidly." She also reported decreases in her ability to process new information or deal with new situations. Her son added that these difficulties increase her anxiety. She lives alone but has support of family and friends. She is independent with dressing and bathing and continues to drive with the assistance of GPS. She manages medications with a pillbox but did acknowledge forgetting to take medications frequently. She is active, walking frequently and doing yoga and tai chi to maintain her balance. No sleep or mood-related concerns were reported. Hallucinations or paranoia was also denied. She also denied headaches, trauma, head injuries, double vision, dizziness, focal numbness or tingling, unilateral weakness, tremors, or urine incontinence or retention. Performance on a brief cognitive screening instrument ([place]OC[place]) was 6/30. Ultimately, Monica Fowler was referred for a comprehensive neuropsychological evaluation to characterize her cognitive abilities and to assist with diagnostic clarity and treatment planning.   Brain MRI on 02/02/2021 revealed moderate generalized cerebral atrophy.  Past Medical History:  Diagnosis Date   High cholesterol    Hypothyroidism    Trigger finger of right hand    RLF    Past Surgical History:  Procedure Laterality Date   SMALL INTESTINE SURGERY     TRIGGER FINGER RELEASE Right 02/21/2015   Procedure: RIGHT LONG TRIGGER RELEASE ;  Surgeon: Leanora Cover, MD;  Location: [facility];  Service: Orthopedics;  Laterality: Right;    Current Outpatient Medications:    donepezil (ARICEPT) 10 MG tablet, Take 1 tablet (10 mg total) by mouth daily. Take half tablet (5 mg) daily for 2  weeks, then increase to the full tablet at 10 mg daily, Disp: 30 tablet, Rfl: 11   levothyroxine (SYNTHROID, LEVOTHROID) 137 MCG tablet, Take 137 mcg by mouth daily before breakfast., Disp: , Rfl:   Clinical Interview:   The following information was obtained during a clinical interview with Monica Fowler and her son prior to cognitive testing.  Cognitive Symptoms: Decreased short-term memory: Endorsed. When asked, Monica Fowler acknowledged generalized memory concerns, stating that she has become far more reliant on taking written notes to help her remember things. Her son stated that she has trouble remembering prior conversations and will sometimes appear to lose information said to her quite rapidly. More pronounced difficulties are seen when in novel or less familiar situations. She also has difficulty understanding newer technology. Difficulties were said to be present for the past several years and have been gradually worsening over time.  Decreased long-term memory: Denied. Decreased attention/concentration: Endorsed. Her son noted that there is perhaps a mild longstanding difficulty with attention/concentration. However, he clarified this by stating that perhaps she was simply busy with her work and was juggling many tasks. Difficulties were said to be present lately, as well as far more pronounced trouble performing mental calculations.  Reduced processing speed: Denied. Difficulties with executive functions: Denied. Impulsivity and significant personality changes were also denied.  Difficulties with emotion regulation: Denied. Difficulties with receptive language: Denied. Difficulties with word finding: Endorsed. Decreased visuoperceptual ability: Denied.  Difficulties completing ADLs: Largely denied. She reported managing and taking her medications independently without difficulty. Medical records do state that she previously told Monica Fowler that she would forget to take her medications  "frequently." Her son has taken over financial management. However, this was largely due to him  switching bills to auto-draft and Monica Fowler not being particularly technologically savvy. Prior to this, he noted a few times where she might write down the wrong amount when paying a bill, but denied any notable trouble forgetting to pay bills or mismanaging finances. She continues to drive locally without issue. Both she and her son expressed concerns surrounding her taking a longer trip by herself.   Additional Medical History: History of traumatic brain injury/concussion: Denied. They both noted that she had likely hit her head several times over her lifetime. However, she denied any instances where she briefly lost consciousness and was never formally evaluated at a medical center.  History of stroke: Denied. History of seizure activity: Denied. History of known exposure to toxins: Denied. Symptoms of chronic pain: Denied. Experience of frequent headaches/migraines: Denied. However, she did report experiencing tension headaches, perhaps at a frequency of once per week.  Frequent instances of dizziness/vertigo: Denied.  Sensory changes: She wears glasses with benefit. She does have a cataract in her left eye and is in the process of hopefully getting this removed. Other sensory changes/difficulties (e.g., hearing, taste, or smell) were denied.  Balance/coordination difficulties: Denied. She also denied a history of recent falls.  Other motor difficulties: Denied.  Sleep History: Estimated hours obtained each night: 7-8 hours. Difficulties falling asleep: Denied. Difficulties staying asleep: Denied. Feels rested and refreshed upon awakening: Endorsed.  History of snoring: Denied. History of waking up gasping for air: Denied. Witnessed breath cessation while asleep: Denied.  History of vivid dreaming: Denied. Excessive movement while asleep: Denied. Instances of acting out her dreams:  Denied.  Psychiatric/Behavioral Health History: Depression: She described her current mood as "good" and denied to her knowledge any prior mental health concerns or diagnoses. Current or remote suicidal ideation, intent, or plan was denied.  Anxiety: Denied. Mania: Denied. Trauma History: Denied. Visual/auditory hallucinations: Denied. Delusional thoughts: Denied.  Tobacco: Denied. Alcohol: She reported consuming about 1/2 glass of wine nightly and denied a history of problematic alcohol abuse or dependence.  Recreational drugs: Denied.  Family History: Problem Relation Age of Onset   Dementia Mother    Depression Mother    Hypertension Father    Heart attack Father    ALS Brother        potential diagnosis   Diabetes Paternal Grandmother    This information was confirmed by Ms. Teddy Spike.  Academic/Vocational History: Highest level of educational attainment: 14 years. She graduated from high school and estimated completing around two years of college. She never earned a formal degree. She described herself as a good (A/B) student in academic settings. No relative weaknesses were identified.  History of developmental delay: Denied. History of grade repetition: Denied. Enrollment in special education courses: Denied. History of LD/ADHD: Denied.  Employment: Retired. 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   Evaluation Results:   Behavioral Observations: Ms. Meland was accompanied by her son, arrived to her appointment on time, and was appropriately dressed and groomed. She appeared alert and oriented. Observed gait and station were within normal limits. Gross motor functioning appeared intact upon informal observation and no abnormal movements (e.g., tremors) were noted. Her affect was generally positive. She did appear nervous about upcoming testing procedures and pleasantly expressed her lack of desire to complete  testing. She agreed to proceed when the rationale for testing was further explained. Spontaneous speech was fluent and word finding difficulties were not observed during the clinical  interview. Thought processes were coherent, organized, and normal in content. Insight into her cognitive difficulties appeared somewhat limited in that cognitive dysfunction is far more pronounced than what she may realize or be willing to admit.   During testing, sustained attention was appropriate. Task engagement was adequate and she persisted when challenged. Word finding difficulties were extremely prominent, with Ms. Bouchie commonly stating that she was "drawing a blank." She would commonly attribute poor performance across testing due to anxiety, stating that she "would do better if at home" and not have any difficulties. She did express anxiety during the testing process and eventually expressed a desire to discontinue. Encouragement was provided by the psychometrist to continue; however, testing was eventually abbreviated in response. Overall, Ms. Glazer was generally cooperative with the clinical interview and subsequent testing procedures.   Adequacy of Effort: The validity of neuropsychological testing is limited by the extent to which the individual being tested may be assumed to have exerted adequate effort during testing. Ms. Green expressed her intention to perform to the best of her abilities and exhibited adequate task engagement and persistence. Scores across stand-alone and embedded performance validity measures were below expectation. However, I feel that this is due to true cognitive impairment rather than attempts to perform poorly. As such, while the results could be taken with a mild degree of caution, I believe that they generally reflect a valid representation of Ms. Laguna current cognitive functioning.  Test Results: Ms. Buonocore was poorly oriented at the time of the current evaluation.  She was unable to state any part of her phone number. She was also unable to state the current year, month, date, day of the week ("Saturday"), time ("6 or 7am"), current clinic, or the city she was currently in. When asked why she was completing testing, she responded with "So I can remember stuff."   Intellectual abilities based upon educational and vocational attainment were estimated to be in the average range. Premorbid abilities were estimated to be within the below average range based upon a single-word reading test.   Processing speed was exceptionally low. Basic attention was well below average. More complex attention (e.g., working memory) was unable to be assessed due to limited testing tolerance. Assessed executive functioning (i.e., cognitive flexibility) was exceptionally low. Other aspects of executive functioning were unable to be assessed due to limited testing tolerance. She performed in the exceptionally low range on a task assessing safety and judgment. Points were generally lost due to vague responses. For example, when asked why one should replace batteries in a smoke detector regularly, she responded with "because you need to do that." There were a few responses suggesting lack of comprehension however. For example, when asked why one shouldn't unplug electrical appliances with wet hands, she responded with "because it's very hot."   Receptive language abilities were unable to be assessed. During testing, Ms. Canter did exhibit difficulties comprehending task instructions, as well as other aspects of the tests themselves. Assessed expressive language (e.g., verbal fluency and confrontation naming) was exceptionally low. Deficits in confrontation naming were quite notable and unexpectedly severe. While she demonstrated appropriate semantic knowledge across a few of these items, for a vast majority, she was unable to provide any near accurate guesses with the provision of semantic or  phonemic cues.     Assessed visuospatial/visuoconstructional abilities were exceptionally low to well below average. On her drawing of a clock, she drew the outer circle appropriately. When placing the numbers, she wrote down 12,  15, 20, 30, 10, 11 in clockwise fashion. No clock hands were attempted. Points were lost on her copy of a complex figure due to some mild visual distortions, as well as two internal aspects being omitted entirely.    Learning (i.e., encoding) of novel verbal information was exceptionally low. Spontaneous delayed recall (i.e., retrieval) of previously learned information was also exceptionally low. Retention rates were 50% (raw score of 1) across a story learning task, 0% across a list learning task, and 21% across a figure drawing task. Performance across recognition tasks was exceptionally low to well below average, suggesting minimal evidence for information consolidation.   Results of emotional screening instruments suggested that recent symptoms of generalized anxiety were in the minimal range, while symptoms of depression were within the mild range. A screening instrument assessing recent sleep quality suggested the presence of minimal sleep dysfunction.  Tables of Scores:   Note: This summary of test scores accompanies the interpretive report and should not be considered in isolation without reference to the appropriate sections in the text. Descriptors are based on appropriate normative data and may be adjusted based on clinical judgment. Terms such as "Within Normal Limits" and "Outside Normal Limits" are used when a more specific description of the test score cannot be determined.       Percentile - Normative Descriptor > 98 - Exceptionally High 91-97 - Well Above Average 75-90 - Above Average 25-74 - Average 9-24 - Below Average 2-8 - Well Below Average < 2 - Exceptionally Low       Validity:   DESCRIPTOR       Dot Counting Test: --- --- Outside Normal  Limits  RBANS Effort Index: --- --- Outside Normal Limits       Orientation:      Raw Score Percentile   NAB Orientation, Form 1 14/29 --- ---       Cognitive Screening:      Raw Score Percentile   SLUMS: 7/30 --- ---       RBANS, Form A: Standard Score/ Scaled Score Percentile   Total Score 44 <1 Exceptionally Low  Immediate Memory 44 <1 Exceptionally Low    List Learning 2 <1 Exceptionally Low    Story Memory 1 <1 Exceptionally Low  Visuospatial/Constructional 58 <1 Exceptionally Low    Figure Copy 4 2 Well Below Average    Line Orientation 5/20 <2 Exceptionally Low  Language 40 <1 Exceptionally Low    Picture Naming 2/10 <2 Exceptionally Low    Semantic Fluency 1 <1 Exceptionally Low  Attention 56 <1 Exceptionally Low    Digit Span 4 2 Well Below Average    Coding 3 1 Exceptionally Low  Delayed Memory 40 <1 Exceptionally Low    List Recall 0/10 <2 Exceptionally Low    List Recognition 12/20 <2 Exceptionally Low    Story Recall 2 <1 Exceptionally Low    Story Recognition 6/12 3-4 Well Below Average    Figure Recall 3 1 Exceptionally Low    Figure Recognition 1/8 1 Exceptionally Low       Intellectual Functioning:      Standard Score Percentile   [place]ona Formula Estimated Premorbid IQ 107 68 Average        Standard Score Percentile   Test of Premorbid Functioning: 84 14 Below Average       Attention/Executive Function:     Trail Making Test (TMT): Raw Score (T Score) Percentile     Part A 91 secs.,  1  error (22) <1 Exceptionally Low    Part B Discontinued --- Impaired        D-KEFS Verbal Fluency Test: Raw Score (Scaled Score) Percentile     Letter Total Correct 18 (5) 5 Well Below Average    Category Total Correct 14 (2) <1 Exceptionally Low    Category Switching Total Correct 0 (1) <1 Exceptionally Low    Category Switching Accuracy 0 (1) <1 Exceptionally Low       NAB Executive Functions Module, Form 1: T Score Percentile     Judgment 26 1 Exceptionally Low        Language:     Verbal Fluency Test: Raw Score (T Score) Percentile     Phonemic Fluency (FAS) 18 (31) 3 Well Below Average    Animal Fluency 9 (26) 1 Exceptionally Low        NAB Language Module, Form 1: T Score Percentile     Naming 2/31 (19) <1 Exceptionally Low       Visuospatial/Visuoconstruction:      Raw Score Percentile   Clock Drawing: 2/10 --- Impaired       Mood and Personality:      Raw Score Percentile   Geriatric Depression Scale: 19 --- Mild  Geriatric Anxiety Scale: 5 --- Minimal    Somatic 3 --- Minimal    Cognitive 1 --- Minimal    Affective 1 --- Minimal       Additional Questionnaires:      Raw Score Percentile   PROMIS Sleep Disturbance Questionnaire: 11 --- None to Slight   Informed Consent and Coding/Compliance:   The current evaluation represents a clinical evaluation for the purposes previously outlined by the referral source and is in no way reflective of a forensic evaluation.   Ms. Stough was provided with a verbal description of the nature and purpose of the present neuropsychological evaluation. Also reviewed were the foreseeable risks and/or discomforts and benefits of the procedure, limits of confidentiality, and mandatory reporting requirements of this provider. The patient was given the opportunity to ask questions and receive answers about the evaluation. Oral consent to participate was provided by the patient.   This evaluation was conducted by Christia Reading, Ph.D., licensed clinical neuropsychologist. Ms. Shuart completed a clinical interview with Dr. Melvyn Novas, billed as one unit [phone], and 105 minutes of cognitive testing and scoring, billed as one unit [phone] and three additional units [id number]. Psychometrist Milana Kidney, B.S., assisted Dr. Melvyn Novas with test administration and scoring procedures. As a separate and discrete service, Dr. Melvyn Novas spent a total of 160 minutes in interpretation and report writing billed as one unit [phone] and two  units 96133.

## 2021-07-13 ENCOUNTER — Other Ambulatory Visit: Payer: Self-pay

## 2021-07-13 ENCOUNTER — Ambulatory Visit (INDEPENDENT_AMBULATORY_CARE_PROVIDER_SITE_OTHER): Payer: PPO | Admitting: Psychology

## 2021-07-13 DIAGNOSIS — F0280 Dementia in other diseases classified elsewhere without behavioral disturbance: Secondary | ICD-10-CM

## 2021-07-13 DIAGNOSIS — G309 Alzheimer's disease, unspecified: Secondary | ICD-10-CM

## 2021-07-13 NOTE — Progress Notes (Signed)
   Neuropsychology Feedback Session Monica Fowler. [facility]  Reason for Referral:   Monica Fowler is a 70 y.o. right-handed Caucasian female referred by  Sharene Butters, PA-C , to characterize her current cognitive functioning and assist with diagnostic clarity and treatment planning in the context of subjective cognitive decline and abnormal performance on a brief cognitive screening instrument ([place] = 6/30).   Feedback:   Monica Fowler completed a comprehensive neuropsychological evaluation on 07/06/2021. Please refer to that encounter for the full report and recommendations. Monica Fowler reported anxiety during the testing process and eventually expressed a desire to discontinue prematurely. Due to limited testing tolerance, several cognitive domains were unable to be adequately assessed or assessed at all. Briefly, results suggested severe, diffuse cognitive impairment, with all assessed cognitive domains scoring in the exceptionally low to well below average normative ranges when compared to age-matched peers. Deficits in confrontation naming and retrieval/consolidation aspects of memory were particularly striking. However, generally speaking, no patterns of strength or weakness strongly emerged. The etiology of her dysfunction is somewhat unclear given the presence of diffuse severe impairment without an observable pattern. Alzheimer's disease should be strongly considered. Per her son's report, he reported that Monica Fowler will forget things stated to her very quickly. Across memory testing, she had significant trouble learning new information even with repeated exposure, was essentially amnestic after short delays, and performed very poorly across yes/no recognition trials. Overall, this suggests a significant memory storage impairment, which is the hallmark characteristic of this condition. Deficits in confrontation naming and verbal fluency (semantic  worse than phonemic) would also be consistent with disease progression.  Monica Fowler was accompanied by her son during the current feedback session. Content of the current session focused on the results of her neuropsychological evaluation. Monica Fowler was given the opportunity to ask questions and her questions were answered. She was encouraged to reach out should additional questions arise. A copy of her report was provided at the conclusion of the visit.      30 minutes were spent conducting the current feedback session with Monica Fowler, billed as one unit [phone].

## 2021-08-03 ENCOUNTER — Ambulatory Visit: Payer: PPO | Admitting: Physician Assistant

## 2021-08-03 DIAGNOSIS — H2512 Age-related nuclear cataract, left eye: Secondary | ICD-10-CM | POA: Diagnosis not present

## 2021-08-03 DIAGNOSIS — H18413 Arcus senilis, bilateral: Secondary | ICD-10-CM | POA: Diagnosis not present

## 2021-08-03 DIAGNOSIS — H2513 Age-related nuclear cataract, bilateral: Secondary | ICD-10-CM | POA: Diagnosis not present

## 2021-08-03 DIAGNOSIS — H25043 Posterior subcapsular polar age-related cataract, bilateral: Secondary | ICD-10-CM | POA: Diagnosis not present

## 2021-08-03 DIAGNOSIS — H40013 Open angle with borderline findings, low risk, bilateral: Secondary | ICD-10-CM | POA: Diagnosis not present

## 2021-08-03 DIAGNOSIS — H25013 Cortical age-related cataract, bilateral: Secondary | ICD-10-CM | POA: Diagnosis not present

## 2021-08-10 ENCOUNTER — Other Ambulatory Visit: Payer: Self-pay

## 2021-08-10 ENCOUNTER — Telehealth (INDEPENDENT_AMBULATORY_CARE_PROVIDER_SITE_OTHER): Payer: PPO | Admitting: Physician Assistant

## 2021-08-10 DIAGNOSIS — G309 Alzheimer's disease, unspecified: Secondary | ICD-10-CM | POA: Diagnosis not present

## 2021-08-10 DIAGNOSIS — F0280 Dementia in other diseases classified elsewhere without behavioral disturbance: Secondary | ICD-10-CM

## 2021-08-10 NOTE — Progress Notes (Signed)
Virtual Visit via Video Note The purpose of this virtual visit is to provide medical care while limiting exposure to the novel coronavirus.    Consent was obtained for video visit:  Yes.   Answered questions that patient had about telehealth interaction:  Yes.   I discussed the limitations, risks, security and privacy concerns of performing an evaluation and management service by telemedicine. I also discussed with the patient that there may be a patient responsible charge related to this service. The patient expressed understanding and agreed to proceed.  Pt location: Home Physician Location: office Name of referring provider:  Lavone Orn, MD I connected with Monica Fowler at patients initiation/request on 08/10/2021 at 11:30 AM EST by video enabled telemedicine application and verified that I am speaking with the correct person using two identifiers. 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  She was last seen on 03/16/2021, at which time a MoCA was 6/30 equal MMSE of 12/30.  MRI at that time showed moderate cerebral atrophy without any other acute abnormality.  Labs were normal.  She was started on donepezil 10 mg daily, and she is tolerating the medicine well without any significant side effects.  Since her last visit, during this video visit, the patient does not want to participate in her video.  Her Monica states that she does not want to be in front of the camera.  He reports that her memory is about the same, without significant changes.  She sleeps well, without waking up in the middle of the night, or  sleepwalking.  She enjoys, walking with her Monica, crochet team, watching birds, watching TV, and her Monica drives her to the store, or to visit friends.  She continues to drive minimally and very short distances.  Although she prefers to be in the house, when she interacts, she may be more social than before.  Her church is planning a trip to [place], which she will be happy to participate.  Her personality is "about the same", no hallucinations or paranoia.  She continues to participate in yoga and tai chi to maintain balance.  She does not use a cane or walker to ambulate.  She is independent of bathing and dressing.  Her Monica is in charge of the medications.  She cooks, and denies leaving the stove on or the faucet on.  Her appetite is good and denies trouble swallowing.  She denies headaches, trauma, head injuries, unilateral weakness, numbness or tingling.  Denies constipation or diarrhea.  Initial evaluation 03/16/2021 "the patient is seen in neurologic consultation at the request of Lavone Orn, MD for the evaluation of memory loss.  The patient is accompanied by Monica [name] who supplements the history.   The patient is a 70 y.o. 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  She would laugh about not knowing how to deal with new technology, or how the remote control would work.  About 1 year ago, the patient began to notice a decline, especially with short-term memory, especially with remembering appointments, new names, or birthdays.  About 6 months ago, the patient  had to start writing on a daily basis things that she wants to remember, because "it will fade rapidly.  She notices decrease in the ability to process new information, or dealing with new situations.  Her Monica adds that this "increases her anxiety, makes her be tearful and at times irritable because she is to be an incredibly independent person, and now she has problems dealing with  simpler tasks". Her Monica states that he had difficulty seeing his mother performance during the MoCA test, taking so much time to understand steps.  Denies living objects in unusual places.  She denies hallucinations or paranoia. The patient lives alone, but has support of family and friends.  Her Monica [name] visits her very frequently, and has a good relationship with her.  She likes to remain active, does a lot of yoga and tai chi to maintain balance.  She likes to walk frequently.  She denies needing a walker or a cane.  She denies becoming lost when walking.  She sleeps well, denies vivid dreams or sleepwalking.  She is independent of bathing and dressing.  She admits to missing her medications frequently, and uses a pillbox.  She cooks and denies leaving the stove on or the faucet on.  Her appetite is very good, despite unintentional weight loss which she attributes to her thyroid disease.  She denies any trouble swallowing.   She continues to drive with GPS.  She denies getting lost, and is afraid of driving long distances. She does her own finances "and still very good at it "and does not miss any payments or over pays. Denies headaches, trauma, or injuries to the head, double vision, dizziness, focal numbness or tingling, unilateral weakness or tremors. Denies urine incontinence or retention. Denies constipation or diarrhea. Denies history of OSA, she drinks 1  glass of wine a day during the female.  She is a former tobacco user quitting in 2009 a 20-pack-year.  She has a family history remarkable for mother with dementia, possibly Alzheimer's".   Available pertinent labs:    TSH .781 (03/09/21) T4 elevated (52022), on Synthroid  CBC normal Lipid panel remarkable for LDL 152, TC 264 o/w normal CMP normal B12 on 12/2020 was 319   MRI brain 02/02/21 w and wo contrast moderate cerebral atrophy without any other acute abnormality     Neurocognitive testing, Dr. Melvyn Novas, 07/13/2021: Ms. Sorrels reported  anxiety during the testing process and eventually expressed a desire to discontinue prematurely. Due to limited testing tolerance, several cognitive domains were unable to be adequately assessed or assessed at all. Briefly, results suggested severe, diffuse cognitive impairment, with all assessed cognitive domains scoring in the exceptionally low to well below average normative ranges when compared to age-matched peers. Deficits in confrontation naming and retrieval/consolidation aspects of memory were particularly striking. However, generally speaking, no patterns of strength or weakness strongly emerged. The etiology of her dysfunction is somewhat unclear given the presence of diffuse severe impairment without an observable pattern. Alzheimer's disease should be strongly considered. Per her Monica's report, he reported that Ms. Harter will forget things stated to her very quickly. Across memory testing, she had significant trouble learning new information even with repeated exposure, was essentially amnestic after short delays, and performed very poorly across yes/no recognition trials. Overall, this suggests a significant memory storage impairment, which is the hallmark characteristic of this condition. Deficits in confrontation naming and verbal fluency (semantic worse than phonemic) would also be consistent with disease progression.  Current Outpatient Medications on File Prior to Visit  Medication Sig Dispense Refill   donepezil (ARICEPT) 10 MG tablet Take 1 tablet (10 mg total) by mouth daily. Take half tablet (5 mg) daily for 2 weeks, then increase to the full tablet at 10 mg daily 30 tablet 11   levothyroxine (SYNTHROID, LEVOTHROID) 137 MCG tablet Take 137 mcg by mouth daily before breakfast.     No current facility-administered medications on file prior to visit.     Observations/Objective:   There were no vitals filed for this visit. GEN:  The patient appears stated age and is in  NAD.  Neurological examination: Patient is awake, alert, oriented x 3. No aphasia or dysarthria. Intact fluency and decreased comprehension. Remote and recent memory impaired.  Able to name and repeat. Cranial nerves: Extraocular movements intact with no nystagmus. No facial asymmetry. Motor: moves all extremities symmetrically, at least anti-gravity x 4. No incoordination on finger to nose testing. Gait: narrow-based and steady, able to tandem walk adequately. Negative Romberg test.    Recommendations: Ms. Fifer has already been prescribed medication given to individuals with memory loss and concerns for Alzheimer's disease. She is encouraged to continue taking this medication as prescribed. It is important to highlight that this medication may slow down functional decline in some individuals. There is no current treatment which can stop or reverse cognitive decline.   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 The patient and her Monica were provided an opportunity to ask questions and all were answered. The patient and her Monica agreed with the plan and demonstrated an understanding of the instructions.   The patient was advised to call back or seek an in-person evaluation if the symptoms worsen or if the condition fails to improve as anticipated.    Total time spent on today's visit was 20 minutes, including both face-to-face time and nonface-to-face time.  Time included that spent on review of records (prior notes available to me/labs/imaging if pertinent), discussing treatment and goals, answering patient's questions and coordinating care.   Sharene Butters, PA-C

## 2021-08-12 MED ORDER — DONEPEZIL HCL 10 MG PO TABS: 10.0000 mg | ORAL_TABLET | Freq: Every day | ORAL | 3 refills | Status: DC

## 2021-08-12 NOTE — Patient Instructions (Signed)
It was a pleasure to see you today at our office.   Recommendations:  Continue donepezil 10 mg daily. Side effects were discussed  Follow up in 6 months   RECOMMENDATIONS FOR ALL PATIENTS WITH MEMORY PROBLEMS: 1. Continue to exercise (Recommend 30 minutes of walking everyday, or 3 hours every week) 2. Increase social interactions - continue going to [place] and enjoy social gatherings with friends and family 3. Eat healthy, avoid fried foods and eat more fruits and vegetables 4. Maintain adequate blood pressure, blood sugar, and blood cholesterol level. Reducing the risk of stroke and cardiovascular disease also helps promoting better memory. 5. Avoid stressful situations. Live a simple life and avoid aggravations. Organize your time and prepare for the next day in anticipation. 6. Sleep well, avoid any interruptions of sleep and avoid any distractions in the bedroom that may interfere with adequate sleep quality 7. Avoid sugar, avoid sweets as there is a strong link between excessive sugar intake, diabetes, and cognitive impairment We discussed the Mediterranean diet, which has been shown to help patients reduce the risk of progressive memory disorders and reduces cardiovascular risk. This includes eating fish, eat fruits and green leafy vegetables, nuts like almonds and hazelnuts, walnuts, and also use olive oil. Avoid fast foods and fried foods as much as possible. Avoid sweets and sugar as sugar use has been linked to worsening of memory function.  There is always a concern of gradual progression of memory problems. If this is the case, then we may need to adjust level of care according to patient needs. Support, both to the patient and caregiver, should then be put into place.    FALL PRECAUTIONS: Be cautious when walking. Scan the area for obstacles that may increase the risk of trips and falls. When getting up in the mornings, sit up at the edge of the bed for a few minutes before getting  out of bed. Consider elevating the bed at the head end to avoid drop of blood pressure when getting up. Walk always in a well-lit room (use night lights in the walls). Avoid area rugs or power cords from appliances in the middle of the walkways. Use a walker or a cane if necessary and consider physical therapy for balance exercise. Get your eyesight checked regularly.  FINANCIAL OVERSIGHT: Supervision, especially oversight when making financial decisions or transactions is also recommended.  HOME SAFETY: Consider the safety of the kitchen when operating appliances like stoves, microwave oven, and blender. Consider having supervision and share cooking responsibilities until no longer able to participate in those. Accidents with firearms and other hazards in the house should be identified and addressed as well.   ABILITY TO BE LEFT ALONE: If patient is unable to contact 911 operator, consider using LifeLine, or when the need is there, arrange for someone to stay with patients. Smoking is a fire hazard, consider supervision or cessation. Risk of wandering should be assessed by caregiver and if detected at any point, supervision and safe proof recommendations should be instituted.  MEDICATION SUPERVISION: Inability to self-administer medication needs to be constantly addressed. Implement a mechanism to ensure safe administration of the medications.   DRIVING: Regarding driving, in patients with progressive memory problems, driving will be impaired. We advise to have someone else do the driving if trouble finding directions or if minor accidents are reported. Independent driving assessment is available to determine safety of driving.   If you are interested in the driving assessment, you can contact the following:  [employer] in [place]  [place] [phone] or  [phone]    [place] refers to food and lifestyle choices that are based on the traditions of countries located on the [employer]. This way of eating has been shown to help prevent certain conditions and improve outcomes for people who have chronic diseases, like kidney disease and heart disease. What are tips for following this plan? Lifestyle  Cook and eat meals together with your family, when possible. Drink enough fluid to keep your urine clear or pale yellow. Be physically active every day. This includes: Aerobic exercise like running or swimming. Leisure activities like gardening, walking, or housework. Get 7-8 hours of sleep each night. If recommended by your health care provider, drink red wine in moderation. This means 1 glass a day for nonpregnant women and 2 glasses a day for men. A glass of wine equals 5 oz (150 mL). Reading food labels  Check the serving size of packaged foods. For foods such as rice and pasta, the serving size refers to the amount of cooked product, not dry. Check the total fat in packaged foods. Avoid foods that have saturated fat or trans fats. Check the ingredients list for added sugars, such as corn syrup. Shopping  At the grocery store, buy most of your food from the areas near the walls of the store. This includes: Fresh fruits and vegetables (produce). Grains, beans, nuts, and seeds. Some of these may be available in unpackaged forms or large amounts (in bulk). Fresh seafood. Poultry and eggs. Low-fat dairy products. Buy whole ingredients instead of prepackaged foods. Buy fresh fruits and vegetables in-season from local farmers markets. Buy frozen fruits and vegetables in resealable bags. If you do not have access to quality fresh seafood, buy precooked frozen shrimp or canned fish, such as tuna, salmon, or sardines. Buy small amounts of raw or cooked vegetables, salads, or olives from the deli or salad bar  at your store. Stock your pantry so you always have certain foods on hand, such as olive oil, canned tuna, canned tomatoes, rice, pasta, and beans. Cooking  Cook foods with extra-virgin olive oil instead of using butter or other vegetable oils. Have meat as a side dish, and have vegetables or grains as your main dish. This means having meat in small portions or adding small amounts of meat to foods like pasta or stew. Use beans or vegetables instead of meat in common dishes like chili or lasagna. Experiment with different cooking methods. Try roasting or broiling vegetables instead of steaming or sauteing them. Add frozen vegetables to soups, stews, pasta, or rice. Add nuts or seeds for added healthy fat at each meal. You can add these to yogurt, salads, or vegetable dishes. Marinate fish or vegetables using olive oil, lemon juice, garlic, and fresh herbs. Meal planning  Plan to eat 1 vegetarian meal one day each week. Try to work up to 2 vegetarian meals, if possible. Eat seafood 2 or more times a week. Have healthy snacks readily available, such as: Vegetable sticks with hummus. Greek yogurt. Fruit and nut trail mix. Eat balanced meals throughout the week. This includes: Fruit: 2-3 servings a day Vegetables: 4-5 servings a day Low-fat dairy: 2 servings a day Fish, poultry, or lean meat: 1 serving a day Beans and legumes: 2 or more servings a week Nuts and seeds: 1-2 servings a day Whole grains: 6-8 servings  a day Extra-virgin olive oil: 3-4 servings a day Limit red meat and sweets to only a few servings a month What are my food choices? Mediterranean diet Recommended Grains: Whole-grain pasta. Brown rice. Bulgar wheat. Polenta. Couscous. Whole-wheat bread. [place]. Vegetables: Artichokes. Beets. Broccoli. Cabbage. Carrots. Eggplant. Green beans. Chard. Kale. Spinach. Onions. Leeks. Peas. Squash. Tomatoes. Peppers. Radishes. Fruits: Apples. Apricots. Avocado. Berries.  Bananas. Cherries. Dates. Figs. Grapes. Lemons. Melon. Oranges. Peaches. Plums. Pomegranate. Meats and other protein foods: Beans. Almonds. Sunflower seeds. Pine nuts. Peanuts. [place]. Salmon. Scallops. Shrimp. [place]. Tilapia. Clams. Oysters. Eggs. Dairy: Low-fat milk. Cheese. Greek yogurt. Beverages: Water. Red wine. Herbal tea. Fats and oils: Extra virgin olive oil. Avocado oil. Grape seed oil. Sweets and desserts: [place] yogurt with honey. Baked apples. Poached pears. Trail mix. Seasoning and other foods: Basil. Cilantro. Coriander. Cumin. Mint. Parsley. Sage. Rosemary. Tarragon. Garlic. Oregano. Thyme. Pepper. Balsalmic vinegar. Tahini. Hummus. Tomato sauce. Olives. Mushrooms. Limit these Grains: Prepackaged pasta or rice dishes. Prepackaged cereal with added sugar. Vegetables: Deep fried potatoes (french fries). Fruits: Fruit canned in syrup. Meats and other protein foods: Beef. Pork. Lamb. Poultry with skin. Hot dogs. [place] Salines. Dairy: Ice cream. Sour cream. Whole milk. Beverages: Juice. Sugar-sweetened soft drinks. Beer. Liquor and spirits. Fats and oils: Butter. [place]ola oil. Vegetable oil. Beef fat (tallow). Lard. Sweets and desserts: Cookies. Cakes. Pies. Candy. Seasoning and other foods: Mayonnaise. Premade sauces and marinades. The items listed may not be a complete list. Talk with your dietitian about what dietary choices are right for you. Summary The Mediterranean diet includes both food and lifestyle choices. Eat a variety of fresh fruits and vegetables, beans, nuts, seeds, and whole grains. Limit the amount of red meat and sweets that you eat. Talk with your health care provider about whether it is safe for you to drink red wine in moderation. This means 1 glass a day for nonpregnant women and 2 glasses a day for men. A glass of wine equals 5 oz (150 mL). This information is not intended to replace advice given to you by your health care provider. Make sure you discuss any questions you  have with your health care provider. Document Released: 05/02/2016 Document Revised: 06/04/2016 Document Reviewed: 05/02/2016 Elsevier Interactive Patient Education  2017 [employer].

## 2021-10-11 DIAGNOSIS — H2512 Age-related nuclear cataract, left eye: Secondary | ICD-10-CM | POA: Diagnosis not present

## 2021-10-12 DIAGNOSIS — H2511 Age-related nuclear cataract, right eye: Secondary | ICD-10-CM | POA: Diagnosis not present

## 2021-10-12 DIAGNOSIS — H2512 Age-related nuclear cataract, left eye: Secondary | ICD-10-CM | POA: Diagnosis not present

## 2022-01-10 DIAGNOSIS — Z23 Encounter for immunization: Secondary | ICD-10-CM | POA: Diagnosis not present

## 2022-01-10 DIAGNOSIS — Z1389 Encounter for screening for other disorder: Secondary | ICD-10-CM | POA: Diagnosis not present

## 2022-01-10 DIAGNOSIS — F02A0 Dementia in other diseases classified elsewhere, mild, without behavioral disturbance, psychotic disturbance, mood disturbance, and anxiety: Secondary | ICD-10-CM | POA: Diagnosis not present

## 2022-01-10 DIAGNOSIS — E890 Postprocedural hypothyroidism: Secondary | ICD-10-CM | POA: Diagnosis not present

## 2022-01-10 DIAGNOSIS — G301 Alzheimer's disease with late onset: Secondary | ICD-10-CM | POA: Diagnosis not present

## 2022-01-10 DIAGNOSIS — Z0000 Encounter for general adult medical examination without abnormal findings: Secondary | ICD-10-CM | POA: Diagnosis not present

## 2022-02-22 DIAGNOSIS — E890 Postprocedural hypothyroidism: Secondary | ICD-10-CM | POA: Diagnosis not present

## 2022-07-12 DIAGNOSIS — G301 Alzheimer's disease with late onset: Secondary | ICD-10-CM | POA: Diagnosis not present

## 2022-07-12 DIAGNOSIS — Z23 Encounter for immunization: Secondary | ICD-10-CM | POA: Diagnosis not present

## 2022-07-12 DIAGNOSIS — E890 Postprocedural hypothyroidism: Secondary | ICD-10-CM | POA: Diagnosis not present

## 2022-09-22 ENCOUNTER — Other Ambulatory Visit: Payer: Self-pay | Admitting: Physician Assistant

## 2022-09-24 IMAGING — MR MR HEAD WO/W CM
12 series · 48 of 48 positions shown · IV contrast (multihance)
Comparison: None.

CLINICAL DATA: Memory loss.

EXAM:
MRI HEAD WITHOUT AND WITH CONTRAST
TECHNIQUE: Multiplanar, multiecho pulse sequences of the brain and surrounding
structures were obtained without and with intravenous contrast.
CONTRAST:  10mL MULTIHANCE GADOBENATE DIMEGLUMINE 529 MG/ML IV SOLN

[Series 2: T1 · sagittal · 5.0mm · 0.45mm/px · 1 of 21 slices shown]
[im 1/21]
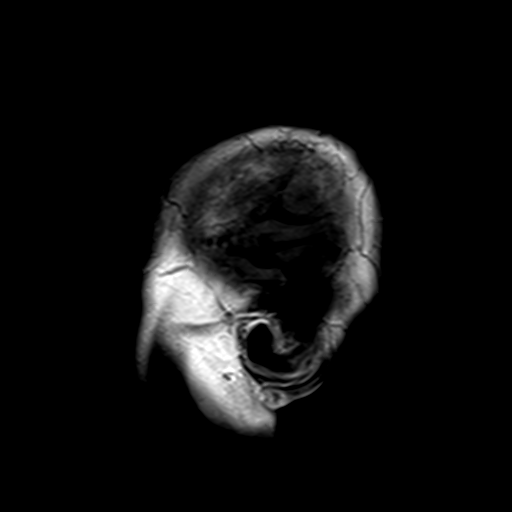

[Series 3: DWI · axial · 3.0mm · 1.80mm/px · z∈[-14,+133]mm · 7 of 100 slices shown (1 of 4)]
[im 1/100]
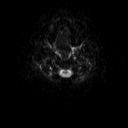
[im 17/100]
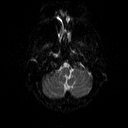
[im 34/100]
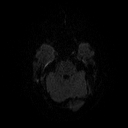
[im 50/100]
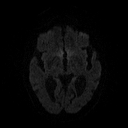
[im 67/100]
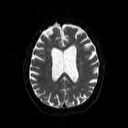
[im 83/100]
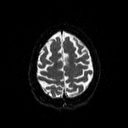
[im 100/100]
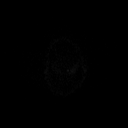

[Series 4: DWI · axial · 3.0mm · 1.80mm/px · z∈[-14,+133]mm · 3 of 49 slices shown (2 of 4)]
[im 1/49]
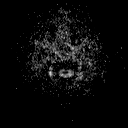
[im 25/49]
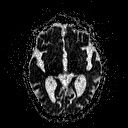
[im 49/49]
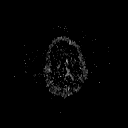

[Series 5: DWI · coronal · 5.0mm · 1.80mm/px · 5 of 68 slices shown (3 of 4)]
[im 1/68]
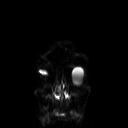
[im 17/68]
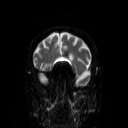
[im 34/68]
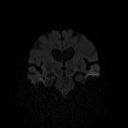
[im 51/68]
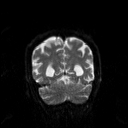
[im 68/68]
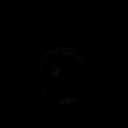

[Series 6: DWI · coronal · 5.0mm · 1.80mm/px · 2 of 34 slices shown (4 of 4)]
[im 1/34]
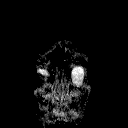
[im 34/34]
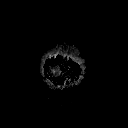

[Series 7: T2 · axial · 5.0mm · 0.90mm/px · z∈[-11,+130]mm · 2 of 22 slices shown (1 of 2)]
[im 1/22]
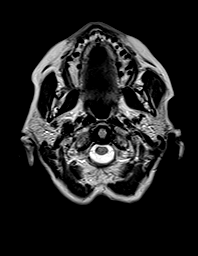
[im 22/22]
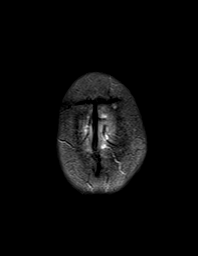

[Series 8: FLAIR · axial · 3.0mm · 0.45mm/px · z∈[-8,+127]mm · 2 of 30 slices shown]
[im 1/30]
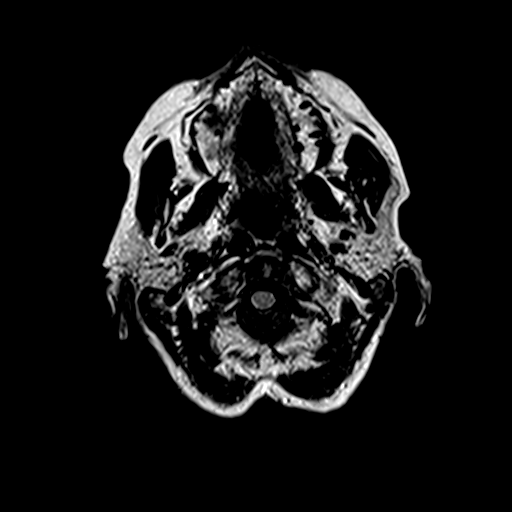
[im 30/30]
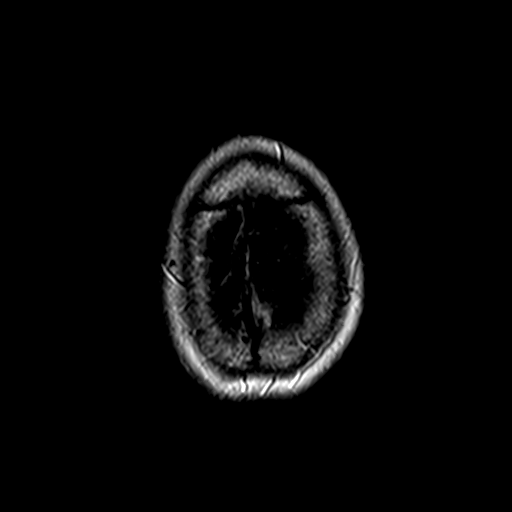

[Series 10: swi_images · axial · 4.0mm · 0.90mm/px · z∈[-10,+130]mm · 2 of 36 slices shown]
[im 1/36]
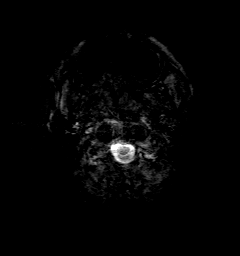
[im 36/36]
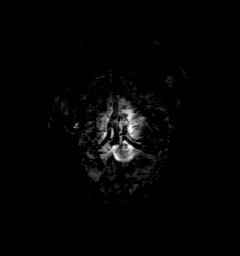

[Series 11: t1_mpr_tra · axial · 1.0mm · 0.94mm/px · z∈[-12,+131]mm · 10 of 144 slices shown]
[im 1/144]
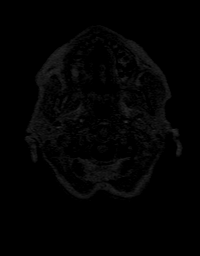
[im 16/144]
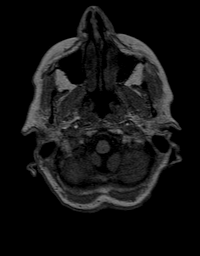
[im 32/144]
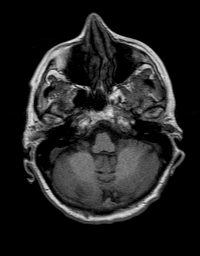
[im 48/144]
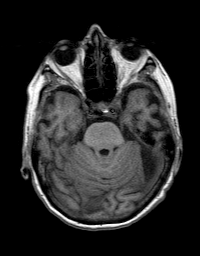
[im 64/144]
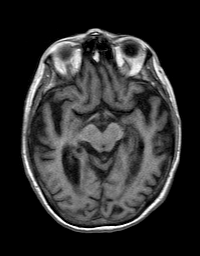
[im 80/144]
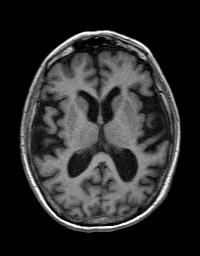
[im 96/144]
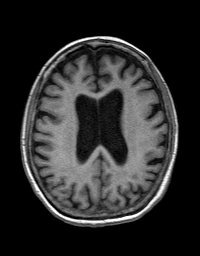
[im 112/144]
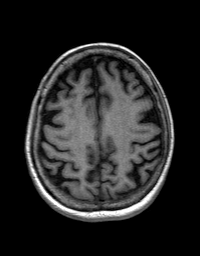
[im 128/144]
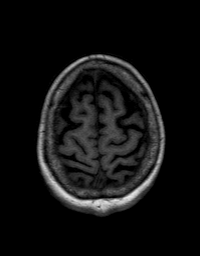
[im 144/144]
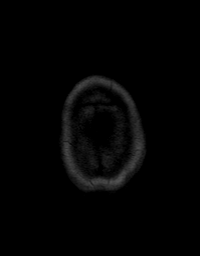

[Series 12: T2 · coronal · 5.0mm · 0.45mm/px · 2 of 25 slices shown (2 of 2)]
[im 1/25]
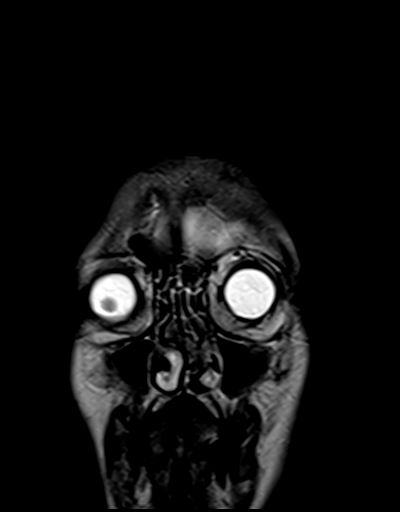
[im 25/25]
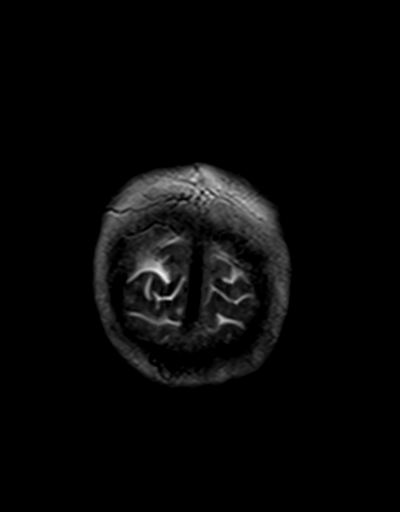

[Series 13: t1_mpr_tra post · axial · 1.0mm · 0.94mm/px · z∈[-12,+131]mm · 10 of 144 slices shown]
[im 1/144]
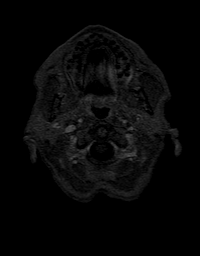
[im 16/144]
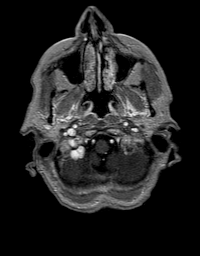
[im 32/144]
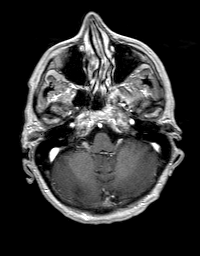
[im 48/144]
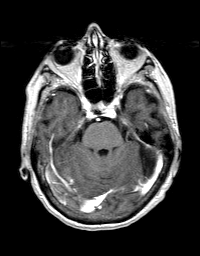
[im 64/144]
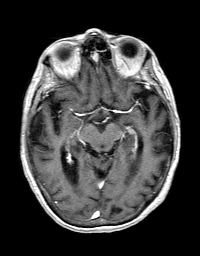
[im 80/144]
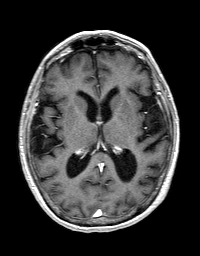
[im 96/144]
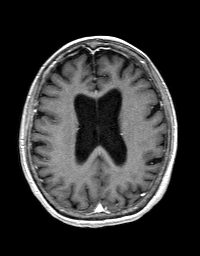
[im 112/144]
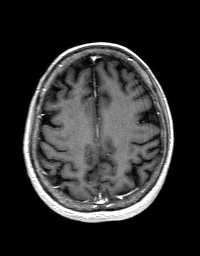
[im 128/144]
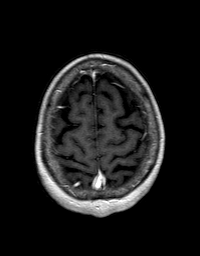
[im 144/144]
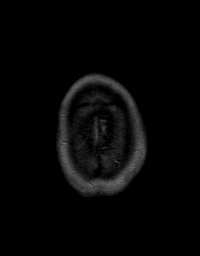

[Series 14: post cor · coronal · 5.0mm · 0.45mm/px · 2 of 25 slices shown]
[im 1/25]
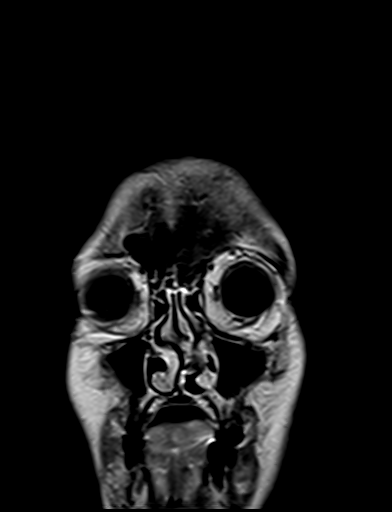
[im 25/25]
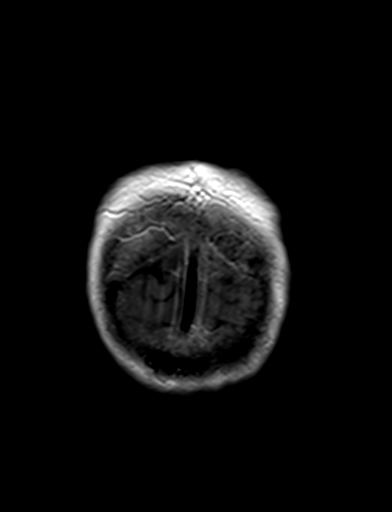

[48 of 48 positions shown; findings below may reference images not displayed]

FINDINGS: Brain: There is no evidence of an acute infarct, intracranial
hemorrhage, mass, midline shift, or extra-axial fluid collection. No
significant white matter disease is seen for age. There is moderate
generalized cerebral atrophy. No abnormal enhancement is identified.

Vascular: Major intracranial vascular flow voids are preserved.

Skull and upper cervical spine: Unremarkable bone marrow signal

Sinuses/Orbits: Unremarkable orbits. Paranasal sinuses and mastoid
air cells are clear.

Other: None.
IMPRESSION: 1. No acute intracranial abnormality.
2. Moderate cerebral atrophy.

## 2022-10-11 ENCOUNTER — Other Ambulatory Visit: Payer: Self-pay | Admitting: Physician Assistant

## 2022-10-14 ENCOUNTER — Other Ambulatory Visit: Payer: Self-pay

## 2022-10-14 ENCOUNTER — Telehealth: Payer: Self-pay | Admitting: Physician Assistant

## 2022-10-14 ENCOUNTER — Other Ambulatory Visit: Payer: Self-pay | Admitting: Physician Assistant

## 2022-10-14 MED ORDER — DONEPEZIL HCL 10 MG PO TABS: 10.0000 mg | ORAL_TABLET | Freq: Every day | ORAL | 3 refills | Status: AC

## 2022-10-14 NOTE — Telephone Encounter (Signed)
Patient has an appt 11/14/22. She is out of her donepezil '10mg'$ . Would like a 30 days supply to get her to feb appt.   Pharmacy-[facility] on [address] [facility]

## 2022-11-14 ENCOUNTER — Encounter: Payer: Self-pay | Admitting: Physician Assistant

## 2022-11-14 ENCOUNTER — Ambulatory Visit: Payer: PPO | Admitting: Physician Assistant

## 2022-11-14 VITALS — BP 147/94 | HR 79 | Resp 18 | Ht 66.0 in | Wt 115.0 lb

## 2022-11-14 DIAGNOSIS — F0280 Dementia in other diseases classified elsewhere without behavioral disturbance: Secondary | ICD-10-CM | POA: Diagnosis not present

## 2022-11-14 DIAGNOSIS — G309 Alzheimer's disease, unspecified: Secondary | ICD-10-CM | POA: Diagnosis not present

## 2022-11-14 MED ORDER — MEMANTINE HCL 5 MG PO TABS: ORAL_TABLET | ORAL | 11 refills | Status: DC

## 2022-11-14 MED ORDER — MEMANTINE HCL 5 MG PO TABS: ORAL_TABLET | ORAL | 11 refills | Status: AC

## 2022-11-14 NOTE — Progress Notes (Signed)
Assessment/Plan:   Dementia likely due to Alzheimer's disease  Monica Fowler is a very pleasant 72 y.o. RH female presenting today in follow-up for evaluation of memory loss. Patient is on donepezil 10 mg daily.  She ran out of medication.  MRI of the brain and June 2022 showed moderate cerebral atrophy without any other acute abnormalities.  Last seen on 08/10/2021    Recommendations:   Follow up in 6  months. Continue donepezil 10 mg daily  Start Memantine 5 mg: Take 1 tablet (5 mg at night) for 2 weeks, then increase to 2tablet (10 mg)nightly for convenience. Side effects discussed    Recommend good control of cardiovascular risk factors.   Continue to control mood as per PCP Agree with Day program to help socialization      Subjective:   This patient is accompanied in the office by her son who supplements the history. Previous records as well as any outside records available were reviewed prior to todays visit.   Patient was last seen via virtual video visit on 08/10/2021.  Last MoCA in June 2022 was 6/30.    Any changes in memory since last visit? "Has not gotten any better, but the rate is slower over the last year. Patient has some difficulty with conversations, more pronounced now " and people names that are familiar to her.  She enjoys walking with cards on, watching birds and watching TV. She backed down on the crochet. She has to be prompted to do . repeats oneself?  Endorsed with medications,  "This is how I do my laundry, how I turn the TV, etc" Disoriented when walking into a room?  Patient denies  Leaving objects in unusual places?  Not as much. "She has a spot for that"  Wandering behavior?   denies  She knows the hood Any personality changes since last visit? Not for the last year   Any worsening depression?:When she is alone, "she does not self entertain"  Hallucinations or paranoia?  denies   Seizures?   denies    Any sleep changes?  Sleeps well, "a lot".   Denies vivid dreams, REM behavior or sleepwalking   Sleep apnea?   denies   Any hygiene concerns? Son reports that "she does not like to make herself up as before " Independent of bathing and dressing?  Endorsed  Does the patient needs help with medications? She mostly manages, has a pillbox. Once a week they go over the meds together . Son works from home 3 times a week .  Who is in charge of the finances?  Son is in charge    Any changes in appetite? She eats well, preparing is a concern, has to be easy meals and snacking. Gained weight   Patient have trouble swallowing?  denies   Does the patient cook?  Any kitchen accidents such as leaving the stove on?   denies   Any headaches?    denies   Vision changes? denies Chronic back pain  denies   Ambulates with difficulty?   " Pretty athletic for age" Recent falls or head injuries?    denies     Unilateral weakness, numbness or tingling?   denies   Any tremors?  denies   Any anosmia?    denies   Any incontinence of urine?  denies   Any bowel dysfunction?  denies      Patient lives  alone but 1/2 week her son is with her. He is  looking to ALF, Day programs, etc Does the patient drive? She no longer drives.   Initial evaluation 03/16/2021 "the patient is seen in neurologic consultation at the request of Lavone Orn, MD for the evaluation of memory loss.  The patient is accompanied by son Monica Fowler who supplements the history.   The patient is a 72 y.o. 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  She would laugh about not knowing how to deal with new technology, or how the remote control would work.  About 1 year ago, the patient began to notice a decline, especially with short-term memory, especially with remembering appointments, new names, or birthdays.  About 6 months ago, the patient had to start writing on a daily basis things that she wants to remember, because "it will fade  rapidly.  She notices decrease in the ability to process new information, or dealing with new situations.  Her son adds that this "increases her anxiety, makes her be tearful and at times irritable because she is to be an incredibly independent person, and now she has problems dealing with simpler tasks". Her son states that he had difficulty seeing his mother performance during the MoCA test, taking so much time to understand steps.  Denies living objects in unusual places.  She denies hallucinations or paranoia. The patient lives alone, but has support of family and friends.  Her son Monica Fowler visits her very frequently, and has a good relationship with her.  She likes to remain active, does a lot of yoga and tai chi to maintain balance.  She likes to walk frequently.  She denies needing a walker or a cane.  She denies becoming lost when walking.  She sleeps well, denies vivid dreams or sleepwalking.  She is independent of bathing and dressing.  She admits to missing her medications frequently, and uses a pillbox.  She cooks and denies leaving the stove on or the faucet on.  Her appetite is very good, despite unintentional weight loss which she attributes to her thyroid disease.  She denies any trouble swallowing.   She continues to drive with GPS.  She denies getting lost, and is afraid of driving long distances. She does her own finances "and still very good at it "and does not miss any payments or over pays. Denies headaches, trauma, or injuries to the head, double vision, dizziness, focal numbness or tingling, unilateral weakness or tremors. Denies urine incontinence or retention. Denies constipation or diarrhea. Denies history of OSA, she drinks 1  glass of wine a day during the female.  She is a former tobacco user quitting in 2009 a 20-pack-year.  She has a family history remarkable for mother with dementia, possibly Alzheimer's".   Available pertinent labs:    TSH .781 (03/09/21) T4 elevated (52022), on  Synthroid  CBC normal Lipid panel remarkable for LDL 152, TC 264 o/w normal CMP normal B12 on 12/2020 was 319   MRI brain 02/02/21 w and wo contrast moderate cerebral atrophy without any other acute abnormality      Neurocognitive testing, Dr. Melvyn Novas, 07/13/2021: Ms. Wellbrock reported anxiety during the testing process and eventually expressed a desire to discontinue prematurely. Due to limited testing tolerance, several cognitive domains were unable to be adequately assessed or assessed at all. Briefly, results suggested severe, diffuse cognitive impairment, with all assessed cognitive domains scoring in the exceptionally low to well below average normative ranges when compared to  age-matched peers. Deficits in confrontation naming and retrieval/consolidation aspects of memory were particularly striking. However, generally speaking, no patterns of strength or weakness strongly emerged. The etiology of her dysfunction is somewhat unclear given the presence of diffuse severe impairment without an observable pattern. Alzheimer's disease should be strongly considered. Per her son's report, he reported that Ms. Brinkmann will forget things stated to her very quickly. Across memory testing, she had significant trouble learning new information even with repeated exposure, was essentially amnestic after short delays, and performed very poorly across yes/no recognition trials. Overall, this suggests a significant memory storage impairment, which is the hallmark characteristic of this condition. Deficits in confrontation naming and verbal fluency (semantic worse than phonemic) would also be consistent with disease progression.  Past Medical History:  Diagnosis Date   High cholesterol    Hypothyroidism    Major neurocognitive disorder due to Alzheimer's disease, possible 07/06/2021   Trigger finger of right hand    RLF     Past Surgical History:  Procedure Laterality Date   SMALL INTESTINE SURGERY      TRIGGER FINGER RELEASE Right 02/21/2015   Procedure: RIGHT LONG TRIGGER RELEASE ;  Surgeon: Leanora Cover, MD;  Location: [place];  Service: Orthopedics;  Laterality: Right;     PREVIOUS MEDICATIONS:   CURRENT MEDICATIONS:  Outpatient Encounter Medications as of 11/14/2022  Medication Sig   donepezil (ARICEPT) 10 MG tablet Take 1 tablet (10 mg total) by mouth daily. Take 1 tablet (10 mg) daily   levothyroxine (SYNTHROID, LEVOTHROID) 137 MCG tablet Take 137 mcg by mouth daily before breakfast.   [DISCONTINUED] memantine (NAMENDA) 5 MG tablet Take 1 tablet (5 mg at night) for 2 weeks, then increase to 1 tablet (5 mg) twice a day   memantine (NAMENDA) 5 MG tablet Take 1 tablet (5 mg at night) for 2 weeks, then increase to 2 tablet (10 mg) nightly   [DISCONTINUED] memantine (NAMENDA) 5 MG tablet Take 1 tablet (5 mg at night) for 2 weeks, then increase to 2 tablet (10 mg) nightly   [DISCONTINUED] memantine (NAMENDA) 5 MG tablet Take 1 tablet (5 mg at night) for 2 weeks, then increase to 2 tablet (10 mg) nightly   No facility-administered encounter medications on file as of 11/14/2022.     Objective:     PHYSICAL EXAMINATION:    VITALS:   Vitals:   11/14/22 1425  BP: (!) 147/94  Pulse: 79  Resp: 18  SpO2: 97%  Weight: 115 lb (52.2 kg)  Height: 5' 6"$  (1.676 m)    GEN:  The patient appears stated age and is in NAD. HEENT:  Normocephalic, atraumatic.   Neurological examination:  General: NAD, well-groomed, appears stated age. Orientation: The patient is alert. Oriented to person, place, not to date Cranial nerves: There is good facial symmetry.The speech is fluent and clear. No aphasia or dysarthria. Fund of knowledge is reduced. Recent and remote memory impaired .Attention and concentration are reduced   Able to name objects and repeat phrases.  Hearing is intact to conversational tone  Sensation: Sensation is intact to light touch throughout Motor: Strength is at  least antigravity x4. Tremors: none  DTR's 2/4 in UE/LE      03/16/2021   10:00 AM  Montreal Cognitive Assessment   Visuospatial/ Executive (0/5) 1  Naming (0/3) 0  Attention: Read list of digits (0/2) 1  Attention: Read list of letters (0/1) 0  Attention: Serial 7 subtraction starting at 100 (0/3)  0  Language: Repeat phrase (0/2) 0  Language : Fluency (0/1) 0  Abstraction (0/2) 0  Delayed Recall (0/5) 0  Orientation (0/6) 4  Total 6  Adjusted Score (based on education) 6        No data to display             Movement examination: Tone: There is normal tone in the UE/LE Abnormal movements:  no tremor.  No myoclonus.  No asterixis.   Coordination:  There is mild  decremation with RAM's. Normal finger to nose R, L with less coordination  Gait and Station: The patient has no difficulty arising out of a deep-seated chair without the use of the hands. The patient's stride length is good.  Gait is cautious and narrow.   Thank you for allowing [place] the opportunity to participate in the care of this nice patient. Please do not hesitate to contact us for any questions or concerns.   Total time spent on today's visit was 39 minutes dedicated to this patient today, preparing to see patient, examining the patient, ordering tests and/or medications and counseling the patient, documenting clinical information in the EHR or other health record, independently interpreting results and communicating results to the patient/family, discussing treatment and goals, answering patient's questions and coordinating care.  Cc:  Charlane Ferretti, MD  Sharene Butters 11/14/2022 3:16 PM

## 2022-11-14 NOTE — Patient Instructions (Signed)
It was a pleasure to see you today at our office.   Recommendations:  Continue donepezil 10 mg daily. Side effects were discussed  Start Memantine 68m tablets.  Take 1 tablet at bedtime for 2 weeks, then 1 table nightly    Follow up in 6 months  Consider [facility]   PLease feel free to call NArnell Asal Social Worker at [facility] [phone] guidance if placement were needed at the facility. Also, you can call [facility] [phone] guidance   Please contact Dr. MAnthoney Haradafor assessment of decision of mental capacity and competency [phone] can also call [facility] [phone] guidance on geriatric dementia issues     RECOMMENDATIONS FOR ALL PATIENTS WITH MEMORY PROBLEMS: 1. Continue to exercise (Recommend 30 minutes of walking everyday, or 3 hours every week) 2. Increase social interactions - continue going to [place] enjoy social gatherings with friends and family 3. Eat healthy, avoid fried foods and eat more fruits and vegetables 4. Maintain adequate blood pressure, blood sugar, and blood cholesterol level. Reducing the risk of stroke and cardiovascular disease also helps promoting better memory. 5. Avoid stressful situations. Live a simple life and avoid aggravations. Organize your time and prepare for the next day in anticipation. 6. Sleep well, avoid any interruptions of sleep and avoid any distractions in the bedroom that may interfere with adequate sleep quality 7. Avoid sugar, avoid sweets as there is a strong link between excessive sugar intake, diabetes, and cognitive impairment We discussed the Mediterranean diet, which has been shown to help patients reduce the risk of progressive memory disorders and reduces cardiovascular risk. This includes eating fish, eat fruits and green leafy vegetables, nuts like almonds and hazelnuts, walnuts, and also use olive oil. Avoid fast foods and fried  foods as much as possible. Avoid sweets and sugar as sugar use has been linked to worsening of memory function.  There is always a concern of gradual progression of memory problems. If this is the case, then we may need to adjust level of care according to patient needs. Support, both to the patient and caregiver, should then be put into place.    FALL PRECAUTIONS: Be cautious when walking. Scan the area for obstacles that may increase the risk of trips and falls. When getting up in the mornings, sit up at the edge of the bed for a few minutes before getting out of bed. Consider elevating the bed at the head end to avoid drop of blood pressure when getting up. Walk always in a well-lit room (use night lights in the walls). Avoid area rugs or power cords from appliances in the middle of the walkways. Use a walker or a cane if necessary and consider physical therapy for balance exercise. Get your eyesight checked regularly.  FINANCIAL OVERSIGHT: Supervision, especially oversight when making financial decisions or transactions is also recommended.  HOME SAFETY: Consider the safety of the kitchen when operating appliances like stoves, microwave oven, and blender. Consider having supervision and share cooking responsibilities until no longer able to participate in those. Accidents with firearms and other hazards in the house should be identified and addressed as well.   ABILITY TO BE LEFT ALONE: If patient is unable to contact 911 operator, consider using LifeLine, or when the need is there, arrange for someone to stay with patients. Smoking is a fire hazard, consider supervision or cessation. Risk of wandering should be assessed by caregiver and if  detected at any point, supervision and safe proof recommendations should be instituted.  MEDICATION SUPERVISION: Inability to self-administer medication needs to be constantly addressed. Implement a mechanism to ensure safe administration of the  medications.    Mediterranean Diet A Mediterranean diet refers to food and lifestyle choices that are based on the traditions of countries located on the [employer]. This way of eating has been shown to help prevent certain conditions and improve outcomes for people who have chronic diseases, like kidney disease and heart disease. What are tips for following this plan? Lifestyle  Cook and eat meals together with your family, when possible. Drink enough fluid to keep your urine clear or pale yellow. Be physically active every day. This includes: Aerobic exercise like running or swimming. Leisure activities like gardening, walking, or housework. Get 7-8 hours of sleep each night. If recommended by your health care provider, drink red wine in moderation. This means 1 glass a day for nonpregnant women and 2 glasses a day for men. A glass of wine equals 5 oz (150 mL). Reading food labels  Check the serving size of packaged foods. For foods such as rice and pasta, the serving size refers to the amount of cooked product, not dry. Check the total fat in packaged foods. Avoid foods that have saturated fat or trans fats. Check the ingredients list for added sugars, such as corn syrup. Shopping  At the grocery store, buy most of your food from the areas near the walls of the store. This includes: Fresh fruits and vegetables (produce). Grains, beans, nuts, and seeds. Some of these may be available in unpackaged forms or large amounts (in bulk). Fresh seafood. Poultry and eggs. Low-fat dairy products. Buy whole ingredients instead of prepackaged foods. Buy fresh fruits and vegetables in-season from local farmers markets. Buy frozen fruits and vegetables in resealable bags. If you do not have access to quality fresh seafood, buy precooked frozen shrimp or canned fish, such as tuna, salmon, or sardines. Buy small amounts of raw or cooked vegetables, salads, or olives from the deli or salad bar  at your store. Stock your pantry so you always have certain foods on hand, such as olive oil, canned tuna, canned tomatoes, rice, pasta, and beans. Cooking  Cook foods with extra-virgin olive oil instead of using butter or other vegetable oils. Have meat as a side dish, and have vegetables or grains as your main dish. This means having meat in small portions or adding small amounts of meat to foods like pasta or stew. Use beans or vegetables instead of meat in common dishes like chili or lasagna. Experiment with different cooking methods. Try roasting or broiling vegetables instead of steaming or sauteing them. Add frozen vegetables to soups, stews, pasta, or rice. Add nuts or seeds for added healthy fat at each meal. You can add these to yogurt, salads, or vegetable dishes. Marinate fish or vegetables using olive oil, lemon juice, garlic, and fresh herbs. Meal planning  Plan to eat 1 vegetarian meal one day each week. Try to work up to 2 vegetarian meals, if possible. Eat seafood 2 or more times a week. Have healthy snacks readily available, such as: Vegetable sticks with hummus. Greek yogurt. Fruit and nut trail mix. Eat balanced meals throughout the week. This includes: Fruit: 2-3 servings a day Vegetables: 4-5 servings a day Low-fat dairy: 2 servings a day Fish, poultry, or lean meat: 1 serving a day Beans and legumes: 2 or more servings a week  Nuts and seeds: 1-2 servings a day Whole grains: 6-8 servings a day Extra-virgin olive oil: 3-4 servings a day Limit red meat and sweets to only a few servings a month What are my food choices? Mediterranean diet Recommended Grains: Whole-grain pasta. Brown rice. Bulgar wheat. Polenta. Couscous. Whole-wheat bread. [place]. Vegetables: Artichokes. Beets. Broccoli. Cabbage. Carrots. Eggplant. Green beans. Chard. Kale. Spinach. Onions. Leeks. Peas. Squash. Tomatoes. Peppers. Radishes. Fruits: Apples. Apricots. Avocado. Berries.  Bananas. Cherries. Dates. Figs. Grapes. Lemons. Melon. Oranges. Peaches. Plums. Pomegranate. Meats and other protein foods: Beans. Almonds. Sunflower seeds. Pine nuts. Peanuts. [place]. Salmon. Scallops. Shrimp. [place]. Tilapia. Clams. Oysters. Eggs. Dairy: Low-fat milk. Cheese. Greek yogurt. Beverages: Water. Red wine. Herbal tea. Fats and oils: Extra virgin olive oil. Avocado oil. Grape seed oil. Sweets and desserts: [place] yogurt with honey. Baked apples. Poached pears. Trail mix. Seasoning and other foods: Basil. Cilantro. Coriander. Cumin. Mint. Parsley. Sage. Rosemary. Tarragon. Garlic. Oregano. Thyme. Pepper. Balsalmic vinegar. Tahini. Hummus. Tomato sauce. Olives. Mushrooms. Limit these Grains: Prepackaged pasta or rice dishes. Prepackaged cereal with added sugar. Vegetables: Deep fried potatoes (french fries). Fruits: Fruit canned in syrup. Meats and other protein foods: Beef. Pork. Lamb. Poultry with skin. Hot dogs. Bernie[place] Salines. Dairy: Ice cream. Sour cream. Whole milk. Beverages: Juice. Sugar-sweetened soft drinks. Beer. Liquor and spirits. Fats and oils: Butter. Canola oil. Vegetable oil. Beef fat (tallow). Lard. Sweets and desserts: Cookies. Cakes. Pies. Candy. Seasoning and other foods: Mayonnaise. Premade sauces and marinades. The items listed may not be a complete list. Talk with your dietitian about what dietary choices are right for you. Summary The Mediterranean diet includes both food and lifestyle choices. Eat a variety of fresh fruits and vegetables, beans, nuts, seeds, and whole grains. Limit the amount of red meat and sweets that you eat. Talk with your health care provider about whether it is safe for you to drink red wine in moderation. This means 1 glass a day for nonpregnant women and 2 glasses a day for men. A glass of wine equals 5 oz (150 mL). This information is not intended to replace advice given to you by your health care provider. Make sure you discuss any questions you  have with your health care provider. Document Released: 05/02/2016 Document Revised: 06/04/2016 Document Reviewed: 05/02/2016 Elsevier Interactive Patient Education  2017 [employer].

## 2023-05-22 ENCOUNTER — Ambulatory Visit: Payer: PPO | Admitting: Physician Assistant

## 2023-07-22 ENCOUNTER — Encounter (HOSPITAL_COMMUNITY): Payer: Self-pay

## 2023-07-22 ENCOUNTER — Ambulatory Visit (HOSPITAL_COMMUNITY)
Admission: RE | Admit: 2023-07-22 | Discharge: 2023-07-22 | Disposition: A | Discharge status: Home / Self Care | Payer: PPO | Source: Ambulatory Visit | Attending: Internal Medicine | Admitting: Internal Medicine

## 2023-07-22 ENCOUNTER — Other Ambulatory Visit: Payer: Self-pay

## 2023-07-22 VITALS — BP 124/84 | HR 84 | Temp 98.2°F | Resp 20

## 2023-07-22 DIAGNOSIS — R350 Frequency of micturition: Secondary | ICD-10-CM | POA: Diagnosis present

## 2023-07-22 DIAGNOSIS — N3090 Cystitis, unspecified without hematuria: Secondary | ICD-10-CM | POA: Insufficient documentation

## 2023-07-22 DIAGNOSIS — G309 Alzheimer's disease, unspecified: Secondary | ICD-10-CM | POA: Diagnosis not present

## 2023-07-22 DIAGNOSIS — F0280 Dementia in other diseases classified elsewhere without behavioral disturbance: Secondary | ICD-10-CM | POA: Diagnosis not present

## 2023-07-22 HISTORY — DX: Pure hypercholesterolemia, unspecified: E78.00

## 2023-07-22 HISTORY — DX: Unspecified dementia, unspecified severity, without behavioral disturbance, psychotic disturbance, mood disturbance, and anxiety: F03.90

## 2023-07-22 HISTORY — DX: Dementia in other diseases classified elsewhere, unspecified severity, without behavioral disturbance, psychotic disturbance, mood disturbance, and anxiety: F02.80

## 2023-07-22 HISTORY — DX: Disorder of thyroid, unspecified: E07.9

## 2023-07-22 LAB — POCT URINALYSIS DIP (MANUAL ENTRY)
Bilirubin, UA: NEGATIVE
Glucose, UA: NEGATIVE mg/dL
Ketones, POC UA: NEGATIVE mg/dL
Leukocytes, UA: NEGATIVE
Nitrite, UA: NEGATIVE
Protein Ur, POC: NEGATIVE mg/dL
Spec Grav, UA: >=1.03 — AB (ref 1.010–1.025)
Urobilinogen, UA: 0.2 U/dL
pH, UA: 5.5 (ref 5.0–8.0)

## 2023-07-22 MED ORDER — NITROFURANTOIN MONOHYD MACRO 100 MG PO CAPS
100.0000 mg | ORAL_CAPSULE | Freq: Two times a day (BID) | ORAL | 0 refills | Status: AC
Start: 2023-07-22 — End: ?

## 2023-07-22 MED ORDER — FLUCONAZOLE 150 MG PO TABS: 150.0000 mg | ORAL_TABLET | Freq: Once | ORAL | 0 refills | Status: AC

## 2023-07-22 NOTE — ED Provider Notes (Signed)
[facility]    CSN: [id number] Arrival date & time: 07/22/23  1535      History   Chief Complaint Chief Complaint  Patient presents with   Appointment    14:00   Urinary Tract Infection    HPI Monica Fowler is a 72 y.o. female.   72 year old female who presents to urgent care with her son who reports that she has been having frequency of going to the restroom at night.  This started about a week ago and he thought it was actually secondary to diarrhea as she was having some of this.  The diarrhea has now resolved and she continues to seem to be getting up in the middle of the night and going to the bathroom.  She is wiping so much she is causing some irritation and rawness in the area.  She does have Alzheimer's dementia and they are working on getting her to a facility as she is starting to be up at night more not just going to the restroom but changing close and doing other activities.  She has not complained of any abdominal pain or pain at all but he is unsure if she is able to completely express what is wrong.   Urinary Tract Infection Associated symptoms: no abdominal pain, no fever and no vomiting     Past Medical History:  Diagnosis Date   Alzheimer dementia (HCC)    Dementia (HCC)    High cholesterol    Thyroid disease     There are no problems to display for this patient.   History reviewed. No pertinent surgical history.  OB History   No obstetric history on file.      Home Medications    Prior to Admission medications   Medication Sig Start Date End Date Taking? Authorizing Provider  NON FORMULARY Synthroad 2 other medications   Yes [provider]    Family History History reviewed. No pertinent family history.  Social History Social History   Tobacco Use   Smoking status: Never   Smokeless tobacco: Never  Vaping Use   Vaping status: Never Used  Substance Use Topics   Alcohol use: Never   Drug use: Never      Allergies   Codeine   Review of Systems Review of Systems  Constitutional:  Negative for chills and fever.  HENT:  Negative for ear pain and sore throat.   Eyes:  Negative for pain and visual disturbance.  Respiratory:  Negative for cough and shortness of breath.   Cardiovascular:  Negative for chest pain and palpitations.  Gastrointestinal:  Negative for abdominal pain and vomiting.  Genitourinary:  Negative for dysuria and hematuria.  Musculoskeletal:  Negative for arthralgias and back pain.  Skin:  Negative for color change and rash.  Neurological:  Negative for seizures and syncope.  All other systems reviewed and are negative.    Physical Exam Triage Vital Signs ED Triage Vitals [07/22/23 1622]  Encounter Vitals Group     BP 124/84     Systolic BP Percentile      Diastolic BP Percentile      Pulse Rate 84     Resp 20     Temp 98.2 F (36.8 C)     Temp Source Oral     SpO2 95 %     Weight      Height      Head Circumference      Peak Flow  Pain Score      Pain Loc      Pain Education      Exclude from Growth Chart    No data found.  Updated Vital Signs BP 124/84 (BP Location: Left Arm)   Pulse 84   Temp 98.2 F (36.8 C) (Oral)   Resp 20   SpO2 95%   Visual Acuity Right Eye Distance:   Left Eye Distance:   Bilateral Distance:    Right Eye Near:   Left Eye Near:    Bilateral Near:     Physical Exam Vitals and nursing note reviewed.  Constitutional:      General: She is not in acute distress.    Appearance: She is well-developed.  HENT:     Head: Normocephalic and atraumatic.  Eyes:     Conjunctiva/sclera: Conjunctivae normal.  Cardiovascular:     Rate and Rhythm: Normal rate and regular rhythm.     Heart sounds: No murmur heard. Pulmonary:     Effort: Pulmonary effort is normal. No respiratory distress.     Breath sounds: Normal breath sounds.  Abdominal:     Palpations: Abdomen is soft.     Tenderness: There is no abdominal  tenderness.  Musculoskeletal:        General: No swelling.     Cervical back: Neck supple.  Skin:    General: Skin is warm and dry.     Capillary Refill: Capillary refill takes less than 2 seconds.  Neurological:     Mental Status: She is alert.  Psychiatric:        Mood and Affect: Mood normal.      UC Treatments / Results  Labs (all labs ordered are listed, but only abnormal results are displayed) Labs Reviewed  POCT URINALYSIS DIP (MANUAL ENTRY)    EKG   Radiology No results found.  Procedures Procedures (including critical care time)  Medications Ordered in UC Medications - No data to display  Initial Impression / Assessment and Plan / UC Course  I have reviewed the triage vital signs and the nursing notes.  Pertinent labs & imaging results that were available during my care of the patient were reviewed by me and considered in my medical decision making (see chart for details).     Frequency of urination  Cystitis   Urinalysis showed only small amount of blood. No WBC, bacteria, nitrites. Could be a very early or mild UTI vs more likely cystitis. Can try and treat for the UTI with understandign that if it is cystitis, then the symptoms may not resolve.  Macrobid take 1 tablet twice daily for 5 days Diflucan 150 mg take 1 tablet today Drink plenty of water May use a zinc/barrier ointment for irritation in the genital region Return to urgent care or PCP if symptoms worsen or fail to resolve.    Final Clinical Impressions(s) / UC Diagnoses   Final diagnoses:  None   Discharge Instructions   None    ED Prescriptions   None    PDMP not reviewed this encounter.   Landis Martins, PA-C 07/22/23 1700

## 2023-07-22 NOTE — ED Triage Notes (Signed)
Reports patient has dementia.  Patient has been going to bathroom more frequently.  Family member reports patient denies discomfort, but something is wrong.  Family member noted patient went to the bathroom about every hour.  At the end of last week, had diarrhea, that is resolved.

## 2023-07-22 NOTE — Discharge Instructions (Addendum)
Urinalysis showed only small amount of blood. No WBC, bacteria, nitrites. Could be a very early or mild UTI vs more likely cystitis. Can try and treat for the UTI with understandign that if it is cystitis, then the symptoms may not resolve.  Macrobid take 1 tablet twice daily for 5 days Diflucan 150 mg take 1 tablet today Drink plenty of water May use a zinc/barrier ointment for irritation in the genital region Return to urgent care or PCP if symptoms worsen or fail to resolve.

## 2023-07-23 ENCOUNTER — Encounter: Payer: Self-pay | Admitting: Physician Assistant

## 2023-07-23 LAB — URINE CULTURE: Culture: <10000 — AB

## 2023-10-12 ENCOUNTER — Other Ambulatory Visit: Payer: Self-pay | Admitting: Physician Assistant
